# Patient Record
Sex: Female | Born: 1955 | Race: White | Hispanic: No | Marital: Married | Smoking: Former smoker
Health system: Southern US, Community
[De-identification: ages and names within clinical notes are randomized; demographics above are authoritative.]

## PROBLEM LIST (undated history)

## (undated) DIAGNOSIS — I10 Essential (primary) hypertension: Secondary | ICD-10-CM

## (undated) DIAGNOSIS — I351 Nonrheumatic aortic (valve) insufficiency: Secondary | ICD-10-CM

## (undated) DIAGNOSIS — F419 Anxiety disorder, unspecified: Secondary | ICD-10-CM

## (undated) DIAGNOSIS — E785 Hyperlipidemia, unspecified: Secondary | ICD-10-CM

## (undated) DIAGNOSIS — R931 Abnormal findings on diagnostic imaging of heart and coronary circulation: Secondary | ICD-10-CM

## (undated) DIAGNOSIS — J45909 Unspecified asthma, uncomplicated: Secondary | ICD-10-CM

## (undated) DIAGNOSIS — I480 Paroxysmal atrial fibrillation: Secondary | ICD-10-CM

## (undated) HISTORY — DX: Paroxysmal atrial fibrillation: I48.0

## (undated) HISTORY — DX: Hyperlipidemia, unspecified: E78.5

## (undated) HISTORY — DX: Nonrheumatic aortic (valve) insufficiency: I35.1

## (undated) HISTORY — DX: Abnormal findings on diagnostic imaging of heart and coronary circulation: R93.1

---

## 2000-01-20 ENCOUNTER — Other Ambulatory Visit: Admission: RE | Admit: 2000-01-20 | Discharge: 2000-01-20 | Payer: Self-pay | Admitting: Obstetrics and Gynecology

## 2000-02-10 ENCOUNTER — Encounter (INDEPENDENT_AMBULATORY_CARE_PROVIDER_SITE_OTHER): Payer: Self-pay | Admitting: Specialist

## 2000-02-10 ENCOUNTER — Ambulatory Visit (HOSPITAL_COMMUNITY): Admission: RE | Admit: 2000-02-10 | Discharge: 2000-02-10 | Payer: Self-pay | Admitting: Obstetrics and Gynecology

## 2002-04-27 ENCOUNTER — Other Ambulatory Visit: Admission: RE | Admit: 2002-04-27 | Discharge: 2002-04-27 | Payer: Self-pay | Admitting: Obstetrics and Gynecology

## 2003-01-02 ENCOUNTER — Encounter: Payer: Self-pay | Admitting: Allergy and Immunology

## 2003-01-02 ENCOUNTER — Encounter: Admission: RE | Admit: 2003-01-02 | Discharge: 2003-01-02 | Payer: Self-pay | Admitting: Allergy and Immunology

## 2007-02-03 ENCOUNTER — Ambulatory Visit: Payer: Self-pay | Admitting: Hematology & Oncology

## 2007-02-07 ENCOUNTER — Ambulatory Visit: Payer: Self-pay | Admitting: Oncology

## 2007-02-08 LAB — CBC WITH DIFFERENTIAL (CANCER CENTER ONLY)
BASO%: 0.2 % (ref 0.0–2.0)
LYMPH#: 1.1 10*3/uL (ref 0.9–3.3)
LYMPH%: 23.9 % (ref 14.0–48.0)
MONO#: 0.2 10*3/uL (ref 0.1–0.9)
NEUT#: 3.3 10*3/uL (ref 1.5–6.5)
Platelets: 445 10*3/uL — ABNORMAL HIGH (ref 145–400)
RDW: 13 % (ref 10.5–14.6)
WBC: 4.8 10*3/uL (ref 3.9–10.0)

## 2007-02-08 LAB — COMPREHENSIVE METABOLIC PANEL
Albumin: 3.8 g/dL (ref 3.5–5.2)
BUN: 16 mg/dL (ref 6–23)
CO2: 25 mEq/L (ref 19–32)
Glucose, Bld: 99 mg/dL (ref 70–99)
Potassium: 4 mEq/L (ref 3.5–5.3)
Sodium: 138 mEq/L (ref 135–145)
Total Protein: 6 g/dL (ref 6.0–8.3)

## 2007-02-08 LAB — IRON AND TIBC
Iron: 35 ug/dL — ABNORMAL LOW (ref 42–145)
TIBC: 409 ug/dL (ref 250–470)
UIBC: 374 ug/dL

## 2007-02-23 LAB — CBC WITH DIFFERENTIAL (CANCER CENTER ONLY)
BASO%: 0.4 % (ref 0.0–2.0)
HCT: 32.2 % — ABNORMAL LOW (ref 34.8–46.6)
LYMPH#: 1.2 10*3/uL (ref 0.9–3.3)
MONO#: 0.2 10*3/uL (ref 0.1–0.9)
NEUT%: 55.4 % (ref 39.6–80.0)
RBC: 3.51 10*6/uL — ABNORMAL LOW (ref 3.70–5.32)
RDW: 12.2 % (ref 10.5–14.6)
WBC: 3.5 10*3/uL — ABNORMAL LOW (ref 3.9–10.0)

## 2007-03-22 ENCOUNTER — Encounter (INDEPENDENT_AMBULATORY_CARE_PROVIDER_SITE_OTHER): Payer: Self-pay | Admitting: Obstetrics and Gynecology

## 2007-03-22 ENCOUNTER — Ambulatory Visit (HOSPITAL_COMMUNITY): Admission: RE | Admit: 2007-03-22 | Discharge: 2007-03-22 | Payer: Self-pay | Admitting: Obstetrics and Gynecology

## 2007-04-01 ENCOUNTER — Ambulatory Visit: Payer: Self-pay | Admitting: Oncology

## 2007-04-04 LAB — CBC WITH DIFFERENTIAL (CANCER CENTER ONLY)
BASO#: 0 10*3/uL (ref 0.0–0.2)
HCT: 37.9 % (ref 34.8–46.6)
HGB: 12.4 g/dL (ref 11.6–15.9)
LYMPH#: 1.1 10*3/uL (ref 0.9–3.3)
MCHC: 32.7 g/dL (ref 32.0–36.0)
MONO#: 0.3 10*3/uL (ref 0.1–0.9)
NEUT%: 60.1 % (ref 39.6–80.0)
WBC: 4 10*3/uL (ref 3.9–10.0)

## 2007-04-04 LAB — IRON AND TIBC
%SAT: 27 % (ref 20–55)
TIBC: 366 ug/dL (ref 250–470)
UIBC: 267 ug/dL

## 2007-04-04 LAB — FERRITIN: Ferritin: 17 ng/mL (ref 10–291)

## 2007-06-24 ENCOUNTER — Ambulatory Visit: Payer: Self-pay | Admitting: Oncology

## 2007-09-05 ENCOUNTER — Encounter (INDEPENDENT_AMBULATORY_CARE_PROVIDER_SITE_OTHER): Payer: Self-pay | Admitting: General Surgery

## 2007-09-05 ENCOUNTER — Ambulatory Visit (HOSPITAL_BASED_OUTPATIENT_CLINIC_OR_DEPARTMENT_OTHER): Admission: RE | Admit: 2007-09-05 | Discharge: 2007-09-05 | Payer: Self-pay | Admitting: General Surgery

## 2009-08-14 ENCOUNTER — Other Ambulatory Visit: Admission: RE | Admit: 2009-08-14 | Discharge: 2009-08-14 | Payer: Self-pay | Admitting: Family Medicine

## 2009-09-04 ENCOUNTER — Ambulatory Visit (HOSPITAL_COMMUNITY): Admission: RE | Admit: 2009-09-04 | Discharge: 2009-09-04 | Payer: Self-pay | Admitting: Family Medicine

## 2010-08-20 ENCOUNTER — Other Ambulatory Visit (HOSPITAL_COMMUNITY)
Admission: RE | Admit: 2010-08-20 | Discharge: 2010-08-20 | Disposition: A | Discharge status: Home / Self Care | Payer: BC Managed Care – PPO | Source: Ambulatory Visit | Attending: Family Medicine | Admitting: Family Medicine

## 2010-08-20 ENCOUNTER — Other Ambulatory Visit: Payer: Self-pay | Admitting: Family Medicine

## 2010-08-20 DIAGNOSIS — Z124 Encounter for screening for malignant neoplasm of cervix: Secondary | ICD-10-CM | POA: Insufficient documentation

## 2010-09-02 NOTE — Op Note (Signed)
[name], [name]            ACCOUNT NO.:  [id number]   MEDICAL RECORD NO.:  [id number]          PATIENT TYPE:  AMB   LOCATION:  DSC                          FACILITY:  [facility]   PHYSICIAN:  Cherylynn Ridges, M.D.    DATE OF BIRTH:  1955-05-30   DATE OF PROCEDURE:  09/05/2007  DATE OF DISCHARGE:                               OPERATIVE REPORT   PREOPERATIVE DIAGNOSIS:  Right inguinal hernia with extension into the  mons pubis.   POSTOPERATIVE DIAGNOSIS:  Right inguinal lipoma extending into the mons  pubis.   PROCEDURE:  Right inguinal exploration with resection of right inguinal  lipoma.   SURGEON:  Cherylynn Ridges, MD   ANESTHESIA:  General endotracheal.   ESTIMATED BLOOD LOSS:  Less than 20 milliliters.   COMPLICATIONS:  None.   CONDITION:  Stable.   FINDINGS:  A 4.5- x 10-cm lipoma of the mons pubis inguinal area.   INDICATION FOR OPERATION:  The patient is a 55 year old with a somewhat  symptomatic lipoma in her right mons pubis inguinal area, who comes in  now for possible right inguinal hernia repair.   FINDINGS:  The patient had no inguinal hernia noted, either direct or  indirect type.  We did open up the external oblique fascia and explored  the round ligament.  No hernia was found.  The ilioinguinal nerve was  spared.   Extending into the mons pubis was an elongated 4.5- x 10-cm lipoma,  which was resected.   OPERATION:  The patient was taken to the operating room, placed on the  table in supine position.  After an adequate general laryngeal airway  anesthetic was administered, she was prepped and draped in the usual  sterile manner, exposing the right inguinal area.   We started off with about a 5-cm transverse curvilinear incision and  extended that to about 7 cm after initial exploration demonstrated this  to be a large lipoma extending into the mons pubis medially and  inferiorly.   After initial incision, we went down through the subcutaneous  tissues  and Scarpa's fascia down to the external oblique fascia.  We immediately  noticed that the bulge that the patient had been complaining about was  coming on from an inferomedial position.  We were able to dissect out  this structure by placing pressure externally over the drape onto the  mons pubis up towards the inguinal area.  This allowed [place] to expose a  large rounded structure, smooth walled, which was subsequently dissected  out and found to be a large lipoma.   Once this structure was removed, it measured 4.5 x 10 cm in size.  We  opened the external oblique along its fibers using Metzenbaum scissors.  We exposed the round ligament and also the ilioinguinal nerve on the  right side.  By mobilizing round ligament, we could see the conjoint  tendon and the floor of the inguinal canal, which appeared to be intact  with no evidence of hernia.  There was no evidence of any type of hernia  going along with the round ligament itself.  Without any direct repair, we closed the external oblique fascia using  running 3-0 Vicryl suture.  We irrigated with antibiotic solution, then  closed the Scarpa's fascia using interrupted 3-0 Vicryl.  We injected  0.5% Marcaine without epinephrine into the wound, approximately 10 mL  were used.  We then closed the skin using running subcuticular suture of  4-0 Monocryl.  Needle counts, sponge counts, and instrument counts were  correct.      Cherylynn Ridges, M.D.  Electronically Signed     JOW/MEDQ  D:  09/05/2007  T:  09/06/2007  Job:  [id number]   cc:   Maxie Better, M.D.

## 2010-09-02 NOTE — Op Note (Signed)
[name], [name]            ACCOUNT NO.:  [id number]   MEDICAL RECORD NO.:  [id number]          PATIENT TYPE:  AMB   LOCATION:  SDC                           FACILITY:  [facility]   PHYSICIAN:  Maxie Better, M.D.DATE OF BIRTH:  17-Mar-1956   DATE OF PROCEDURE:  03/22/2007  DATE OF DISCHARGE:                               OPERATIVE REPORT   PREOPERATIVE DIAGNOSES:  1. Menometrorrhagia.  2. Endometrial polyps.   PROCEDURE:  1. Diagnostic hysteroscopy with hysteroscopic resection of endometrial      polyps.  2. Dilatation and curettage.   POSTOPERATIVE DIAGNOSES:  1. Menometrorrhagia.  2. Endometrial polyps.   ANESTHESIA:  General; paracervical block.   SURGEON:  Maxie Better, M.D.   INDICATIONS:  This is a 55 year old the patient who has had  menometrorrhagia with associated severe iron deficiency anemia due to  multiple endometrial masses presumed to be endometrial polyps seen on  sonohysterogram, who now presents for surgical management.  The patient  was treated with Norethindrone to manage her bleeding; in addition, she  received IV iron supplementation in order to facilitate the increase of  her hemoglobin from 6 to 12.  The patient now presents for surgical  management.  Surgical risks had been reviewed with the patient.  Consent  was signed.  The patient was transferred to the operating room.   PROCEDURE:  Under adequate general anesthesia, the patient was placed in  the dorsal lithotomy position.  Examination under anesthesia revealed an  anteverted, slightly enlarged uterus.  No adnexal masses could be  appreciated.  The patient was sterilely prepped and draped in the usual  fashion.  Bladder was catheterized for a small amount of urine.  A  bivalve speculum was placed in the vagina; prior to that, there was a  bulging mass noted in the right vulva, mediolateral to the level of the  clitoris and in the mons pubis area, extending down and very mobile  with  question of  bowel herniation or other cystic mass of the mons pubis was  entertained and will be followed up in the postoperative period.  Nonetheless, after the bivalve speculum was placed in the vagina, 10 mL  of 1% Nesacaine were injected paracervical at 3 and 9 o'clock.  The  cervix was parous.  The anterior lip of the cervix was grasped with a  single-toothed tenaculum.  The cervix was easily dilated up to the #25  [facility] dilator.  Therefore, the resectoscope with a single loop was  introduced into the uterine cavity.  Blood was noted, as was the  polypoid lesion and no distinct identification of any areas of the  uterus otherwise was noted.  Therefore, the resectoscope was removed.  The cavity was then curetted and explored with the ring forceps.  At  this time, the resectoscope was then reinserted.  Multiple polypoid  lesions throughout the cavity were noted.  The right tubal ostium  initially could be seen well.  Those polypoid lesions were then removed  anteriorly and posteriorly and on the left, after removal of the  polypoid lesion, the left tubal ostia could then ultimately  be seen.  This was carried down until all polypoid lesions were resected, cavity  curetted and resected.  When the cavity was felt to have been cleaned of  all polypoid lesions, the procedure was terminated by removing all  instruments.  Specimen labeled endometrial polyps with resections  were sent to Pathology, as were as endometrial curettings.  Estimated  blood loss was minimal.  Fluid deficit was 310 cc sorbitol.  Complication:  None.  The patient tolerated the procedure well and was  transferred to recovery room in stable condition.      Maxie Better, M.D.  Electronically Signed     /MEDQ  D:  03/22/2007  T:  03/23/2007  Job:  [id number]

## 2010-09-05 NOTE — Op Note (Signed)
[facility]  Patient:    [name]                    MRN: [id number] Proc. Date: 02/10/00 Attending:  Janeece Riggers. Dareen Piano, M.D.                           Operative Report  PREOPERATIVE DIAGNOSES:       1. Chronic left lower quadrant pain.                               2. Left ovarian cyst.  POSTOPERATIVE DIAGNOSES:      1. Chronic left lower quadrant pain.                               2. Left ovarian cyst.  PROCEDURE:                    Diagnostic laparoscopy with left oophorectomy.  SURGEON:                      Mark E. Dareen Piano, M.D.  ANESTHESIA:                   General endotracheal.  ANTIBIOTICS:                  Ancef 1 g.  ESTIMATED BLOOD LOSS:         Minimal.  COMPLICATIONS:                None.  SPECIMENS:                    Left tube and ovary sent to pathology.  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  The gallbladder appeared to be normal.  The appendix was surgically absent; however, the cecum did have adhesions to the anterior abdominal wall which were transected.  The anterior cul-de-sac was normal.  The posterior cul-de-sac had no evidence of endometriosis or adhesions.  The right ovary had a 2.0 cm follicular cyst which was drained.  There was no evidence of endometriosis.  The fallopian tube on the right was normal.  The fallopian tube on the left was normal.  The patients left ovary was somewhat irregular in shape; however, no definitive cyst was identified.  In light of this, the surgery was paused, and I went out to discuss the findings with the husband. After a discussion about the pros and cons of an oophorectomy, we agreed to proceed with a left oophorectomy and salpingectomy.  At this point, the infundibular pelvic ligament was isolated.  The ureter was identified.  The infundibular pelvic ligament was  cauterized and transected.  The ovarian ligament and fallopian tube were cauterized and transected.  The specimen was put in an Endo bag and then removed through the lower abdominal incision.  The adhesions between the cecum and the anterior abdominal wall were then taken down sharply.  Hemostasis was checked again and felt to be adequate.  On removing the ovary there was evidence of old blood in the ovary, consistent with the resulting hemorrhagic cyst.  The patient tolerated the  procedure well and was taken to the recovery room in stable condition.  DISPOSITION:                  She was discharged home with Tylox to take p.r.n.  She will follow up in the office in four weeks. DD:  02/10/00 TD:  02/10/00 Job: [id number]

## 2010-09-12 ENCOUNTER — Other Ambulatory Visit (HOSPITAL_COMMUNITY): Payer: Self-pay | Admitting: Family Medicine

## 2010-09-12 DIAGNOSIS — Z1231 Encounter for screening mammogram for malignant neoplasm of breast: Secondary | ICD-10-CM

## 2010-09-26 ENCOUNTER — Ambulatory Visit (HOSPITAL_COMMUNITY)
Admission: RE | Admit: 2010-09-26 | Discharge: 2010-09-26 | Disposition: A | Discharge status: Home / Self Care | Payer: BC Managed Care – PPO | Source: Ambulatory Visit | Attending: Family Medicine | Admitting: Family Medicine

## 2010-09-26 DIAGNOSIS — Z1231 Encounter for screening mammogram for malignant neoplasm of breast: Secondary | ICD-10-CM | POA: Insufficient documentation

## 2011-01-26 LAB — CBC
HCT: 38.3
Hemoglobin: 12.8
MCHC: 33.4
MCV: 90.3
RBC: 4.24

## 2011-09-23 ENCOUNTER — Other Ambulatory Visit (HOSPITAL_COMMUNITY): Payer: Self-pay | Admitting: Family Medicine

## 2011-09-23 DIAGNOSIS — Z1231 Encounter for screening mammogram for malignant neoplasm of breast: Secondary | ICD-10-CM

## 2011-10-14 ENCOUNTER — Ambulatory Visit (HOSPITAL_COMMUNITY)
Admission: RE | Admit: 2011-10-14 | Discharge: 2011-10-14 | Disposition: A | Discharge status: Home / Self Care | Payer: BC Managed Care – PPO | Source: Ambulatory Visit | Attending: Family Medicine | Admitting: Family Medicine

## 2011-10-14 DIAGNOSIS — Z1231 Encounter for screening mammogram for malignant neoplasm of breast: Secondary | ICD-10-CM

## 2012-03-08 ENCOUNTER — Other Ambulatory Visit: Payer: Self-pay | Admitting: Family Medicine

## 2012-03-08 ENCOUNTER — Other Ambulatory Visit (HOSPITAL_COMMUNITY)
Admission: RE | Admit: 2012-03-08 | Discharge: 2012-03-08 | Disposition: A | Discharge status: Home / Self Care | Payer: BC Managed Care – PPO | Source: Ambulatory Visit | Attending: Family Medicine | Admitting: Family Medicine

## 2012-03-08 DIAGNOSIS — Z124 Encounter for screening for malignant neoplasm of cervix: Secondary | ICD-10-CM | POA: Insufficient documentation

## 2012-09-20 ENCOUNTER — Other Ambulatory Visit (HOSPITAL_COMMUNITY): Payer: Self-pay | Admitting: Family Medicine

## 2012-09-20 DIAGNOSIS — Z1231 Encounter for screening mammogram for malignant neoplasm of breast: Secondary | ICD-10-CM

## 2012-10-18 ENCOUNTER — Ambulatory Visit (HOSPITAL_COMMUNITY)
Admission: RE | Admit: 2012-10-18 | Discharge: 2012-10-18 | Disposition: A | Discharge status: Home / Self Care | Payer: BC Managed Care – PPO | Source: Ambulatory Visit | Attending: Family Medicine | Admitting: Family Medicine

## 2012-10-18 DIAGNOSIS — Z1231 Encounter for screening mammogram for malignant neoplasm of breast: Secondary | ICD-10-CM

## 2013-10-05 ENCOUNTER — Other Ambulatory Visit (HOSPITAL_COMMUNITY): Payer: Self-pay | Admitting: Family Medicine

## 2013-10-05 DIAGNOSIS — Z1231 Encounter for screening mammogram for malignant neoplasm of breast: Secondary | ICD-10-CM

## 2013-10-23 ENCOUNTER — Ambulatory Visit (HOSPITAL_COMMUNITY)
Admission: RE | Admit: 2013-10-23 | Discharge: 2013-10-23 | Disposition: A | Discharge status: Home / Self Care | Payer: BC Managed Care – PPO | Source: Ambulatory Visit | Attending: Family Medicine | Admitting: Family Medicine

## 2013-10-23 DIAGNOSIS — Z1231 Encounter for screening mammogram for malignant neoplasm of breast: Secondary | ICD-10-CM

## 2014-10-02 ENCOUNTER — Other Ambulatory Visit (HOSPITAL_COMMUNITY): Payer: Self-pay | Admitting: Family Medicine

## 2014-10-02 DIAGNOSIS — Z1231 Encounter for screening mammogram for malignant neoplasm of breast: Secondary | ICD-10-CM

## 2014-10-25 ENCOUNTER — Ambulatory Visit (HOSPITAL_COMMUNITY)
Admission: RE | Admit: 2014-10-25 | Discharge: 2014-10-25 | Disposition: A | Discharge status: Home / Self Care | Payer: BC Managed Care – PPO | Source: Ambulatory Visit | Attending: Family Medicine | Admitting: Family Medicine

## 2014-10-25 DIAGNOSIS — Z1231 Encounter for screening mammogram for malignant neoplasm of breast: Secondary | ICD-10-CM | POA: Diagnosis present

## 2015-10-15 ENCOUNTER — Other Ambulatory Visit: Payer: Self-pay | Admitting: Family Medicine

## 2015-10-15 ENCOUNTER — Other Ambulatory Visit (HOSPITAL_COMMUNITY)
Admission: RE | Admit: 2015-10-15 | Discharge: 2015-10-15 | Disposition: A | Discharge status: Home / Self Care | Payer: BC Managed Care – PPO | Source: Ambulatory Visit | Attending: Family Medicine | Admitting: Family Medicine

## 2015-10-15 DIAGNOSIS — Z1151 Encounter for screening for human papillomavirus (HPV): Secondary | ICD-10-CM | POA: Insufficient documentation

## 2015-10-15 DIAGNOSIS — Z01419 Encounter for gynecological examination (general) (routine) without abnormal findings: Secondary | ICD-10-CM | POA: Insufficient documentation

## 2015-10-16 LAB — CYTOLOGY - PAP

## 2015-10-17 ENCOUNTER — Other Ambulatory Visit: Payer: Self-pay | Admitting: Family Medicine

## 2015-10-17 DIAGNOSIS — Z1231 Encounter for screening mammogram for malignant neoplasm of breast: Secondary | ICD-10-CM

## 2015-11-05 ENCOUNTER — Other Ambulatory Visit: Payer: Self-pay | Admitting: Family Medicine

## 2015-11-05 ENCOUNTER — Ambulatory Visit
Admission: RE | Admit: 2015-11-05 | Discharge: 2015-11-05 | Disposition: A | Discharge status: Home / Self Care | Payer: BC Managed Care – PPO | Source: Ambulatory Visit | Attending: Family Medicine | Admitting: Family Medicine

## 2015-11-05 DIAGNOSIS — Z1231 Encounter for screening mammogram for malignant neoplasm of breast: Secondary | ICD-10-CM

## 2016-10-01 ENCOUNTER — Other Ambulatory Visit: Payer: Self-pay | Admitting: Family Medicine

## 2016-10-01 DIAGNOSIS — Z1231 Encounter for screening mammogram for malignant neoplasm of breast: Secondary | ICD-10-CM

## 2016-11-05 ENCOUNTER — Ambulatory Visit
Admission: RE | Admit: 2016-11-05 | Discharge: 2016-11-05 | Disposition: A | Discharge status: Home / Self Care | Payer: BC Managed Care – PPO | Source: Ambulatory Visit | Attending: Family Medicine | Admitting: Family Medicine

## 2016-11-05 DIAGNOSIS — Z1231 Encounter for screening mammogram for malignant neoplasm of breast: Secondary | ICD-10-CM

## 2017-04-23 ENCOUNTER — Ambulatory Visit
Admission: RE | Admit: 2017-04-23 | Discharge: 2017-04-23 | Disposition: A | Discharge status: Home / Self Care | Payer: BC Managed Care – PPO | Source: Ambulatory Visit | Attending: Family Medicine | Admitting: Family Medicine

## 2017-04-23 ENCOUNTER — Other Ambulatory Visit: Payer: Self-pay | Admitting: Family Medicine

## 2017-04-23 DIAGNOSIS — M79661 Pain in right lower leg: Secondary | ICD-10-CM

## 2017-04-23 DIAGNOSIS — R609 Edema, unspecified: Secondary | ICD-10-CM

## 2017-10-04 ENCOUNTER — Other Ambulatory Visit: Payer: Self-pay | Admitting: Family Medicine

## 2017-10-04 DIAGNOSIS — Z1231 Encounter for screening mammogram for malignant neoplasm of breast: Secondary | ICD-10-CM

## 2017-11-08 ENCOUNTER — Ambulatory Visit
Admission: RE | Admit: 2017-11-08 | Discharge: 2017-11-08 | Disposition: A | Discharge status: Home / Self Care | Payer: BC Managed Care – PPO | Source: Ambulatory Visit | Attending: Family Medicine | Admitting: Family Medicine

## 2017-11-08 DIAGNOSIS — Z1231 Encounter for screening mammogram for malignant neoplasm of breast: Secondary | ICD-10-CM

## 2019-01-02 ENCOUNTER — Other Ambulatory Visit (HOSPITAL_COMMUNITY)
Admission: RE | Admit: 2019-01-02 | Discharge: 2019-01-02 | Disposition: A | Discharge status: Home / Self Care | Payer: BC Managed Care – PPO | Source: Ambulatory Visit | Attending: Family Medicine | Admitting: Family Medicine

## 2019-01-02 ENCOUNTER — Other Ambulatory Visit: Payer: Self-pay | Admitting: Family Medicine

## 2019-01-02 DIAGNOSIS — Z01411 Encounter for gynecological examination (general) (routine) with abnormal findings: Secondary | ICD-10-CM | POA: Insufficient documentation

## 2019-01-05 LAB — CYTOLOGY - PAP
Diagnosis: NEGATIVE
HPV: NOT DETECTED

## 2019-01-06 ENCOUNTER — Other Ambulatory Visit: Payer: Self-pay | Admitting: Family Medicine

## 2019-01-06 DIAGNOSIS — Z1231 Encounter for screening mammogram for malignant neoplasm of breast: Secondary | ICD-10-CM

## 2019-02-21 ENCOUNTER — Ambulatory Visit: Payer: BC Managed Care – PPO

## 2019-04-05 ENCOUNTER — Ambulatory Visit: Payer: BC Managed Care – PPO

## 2019-05-22 ENCOUNTER — Ambulatory Visit
Admission: RE | Admit: 2019-05-22 | Discharge: 2019-05-22 | Disposition: A | Discharge status: Home / Self Care | Payer: BC Managed Care – PPO | Source: Ambulatory Visit | Attending: Family Medicine | Admitting: Family Medicine

## 2019-05-22 ENCOUNTER — Other Ambulatory Visit: Payer: Self-pay

## 2019-05-22 DIAGNOSIS — Z1231 Encounter for screening mammogram for malignant neoplasm of breast: Secondary | ICD-10-CM

## 2020-08-19 ENCOUNTER — Other Ambulatory Visit: Payer: Self-pay | Admitting: Family Medicine

## 2020-08-19 DIAGNOSIS — Z1231 Encounter for screening mammogram for malignant neoplasm of breast: Secondary | ICD-10-CM

## 2020-10-10 ENCOUNTER — Other Ambulatory Visit: Payer: Self-pay

## 2020-10-10 ENCOUNTER — Ambulatory Visit
Admission: RE | Admit: 2020-10-10 | Discharge: 2020-10-10 | Disposition: A | Discharge status: Home / Self Care | Payer: BC Managed Care – PPO | Source: Ambulatory Visit | Attending: Family Medicine | Admitting: Family Medicine

## 2020-10-10 DIAGNOSIS — Z1231 Encounter for screening mammogram for malignant neoplasm of breast: Secondary | ICD-10-CM

## 2021-01-22 ENCOUNTER — Other Ambulatory Visit: Payer: Self-pay | Admitting: Family Medicine

## 2021-01-22 DIAGNOSIS — Z13820 Encounter for screening for osteoporosis: Secondary | ICD-10-CM

## 2021-07-15 ENCOUNTER — Ambulatory Visit
Admission: RE | Admit: 2021-07-15 | Discharge: 2021-07-15 | Disposition: A | Discharge status: Home / Self Care | Payer: Medicare PPO | Source: Ambulatory Visit | Attending: Family Medicine | Admitting: Family Medicine

## 2021-07-15 DIAGNOSIS — Z13820 Encounter for screening for osteoporosis: Secondary | ICD-10-CM

## 2022-01-01 DIAGNOSIS — I10 Essential (primary) hypertension: Secondary | ICD-10-CM | POA: Diagnosis not present

## 2022-01-01 DIAGNOSIS — U071 COVID-19: Secondary | ICD-10-CM | POA: Diagnosis not present

## 2022-01-19 ENCOUNTER — Other Ambulatory Visit: Payer: Self-pay | Admitting: Family Medicine

## 2022-01-19 ENCOUNTER — Other Ambulatory Visit (HOSPITAL_COMMUNITY): Payer: Self-pay | Admitting: Family Medicine

## 2022-01-19 DIAGNOSIS — Z0000 Encounter for general adult medical examination without abnormal findings: Secondary | ICD-10-CM | POA: Diagnosis not present

## 2022-01-19 DIAGNOSIS — E039 Hypothyroidism, unspecified: Secondary | ICD-10-CM | POA: Diagnosis not present

## 2022-01-19 DIAGNOSIS — Z79899 Other long term (current) drug therapy: Secondary | ICD-10-CM | POA: Diagnosis not present

## 2022-01-19 DIAGNOSIS — F410 Panic disorder [episodic paroxysmal anxiety] without agoraphobia: Secondary | ICD-10-CM | POA: Diagnosis not present

## 2022-01-19 DIAGNOSIS — E7800 Pure hypercholesterolemia, unspecified: Secondary | ICD-10-CM | POA: Diagnosis not present

## 2022-01-19 DIAGNOSIS — J4520 Mild intermittent asthma, uncomplicated: Secondary | ICD-10-CM | POA: Diagnosis not present

## 2022-01-19 DIAGNOSIS — I10 Essential (primary) hypertension: Secondary | ICD-10-CM | POA: Diagnosis not present

## 2022-01-19 DIAGNOSIS — R0981 Nasal congestion: Secondary | ICD-10-CM | POA: Diagnosis not present

## 2022-01-21 ENCOUNTER — Other Ambulatory Visit: Payer: Self-pay | Admitting: Family Medicine

## 2022-01-21 DIAGNOSIS — Z1231 Encounter for screening mammogram for malignant neoplasm of breast: Secondary | ICD-10-CM

## 2022-02-13 ENCOUNTER — Encounter (HOSPITAL_BASED_OUTPATIENT_CLINIC_OR_DEPARTMENT_OTHER): Payer: Self-pay

## 2022-02-13 ENCOUNTER — Other Ambulatory Visit: Payer: Self-pay

## 2022-02-13 ENCOUNTER — Emergency Department (HOSPITAL_BASED_OUTPATIENT_CLINIC_OR_DEPARTMENT_OTHER): Payer: Medicare PPO | Admitting: Radiology

## 2022-02-13 ENCOUNTER — Emergency Department (HOSPITAL_BASED_OUTPATIENT_CLINIC_OR_DEPARTMENT_OTHER)
Admission: EM | Admit: 2022-02-13 | Discharge: 2022-02-13 | Disposition: A | Discharge status: Home / Self Care | Payer: Medicare PPO | Attending: Emergency Medicine | Admitting: Emergency Medicine

## 2022-02-13 DIAGNOSIS — R002 Palpitations: Secondary | ICD-10-CM | POA: Diagnosis present

## 2022-02-13 DIAGNOSIS — Z7901 Long term (current) use of anticoagulants: Secondary | ICD-10-CM | POA: Diagnosis not present

## 2022-02-13 DIAGNOSIS — I10 Essential (primary) hypertension: Secondary | ICD-10-CM | POA: Insufficient documentation

## 2022-02-13 DIAGNOSIS — I480 Paroxysmal atrial fibrillation: Secondary | ICD-10-CM | POA: Diagnosis not present

## 2022-02-13 DIAGNOSIS — R009 Unspecified abnormalities of heart beat: Secondary | ICD-10-CM | POA: Diagnosis not present

## 2022-02-13 DIAGNOSIS — I4891 Unspecified atrial fibrillation: Secondary | ICD-10-CM | POA: Insufficient documentation

## 2022-02-13 DIAGNOSIS — J9811 Atelectasis: Secondary | ICD-10-CM | POA: Diagnosis not present

## 2022-02-13 DIAGNOSIS — Z79899 Other long term (current) drug therapy: Secondary | ICD-10-CM | POA: Diagnosis not present

## 2022-02-13 DIAGNOSIS — J45909 Unspecified asthma, uncomplicated: Secondary | ICD-10-CM | POA: Insufficient documentation

## 2022-02-13 DIAGNOSIS — R030 Elevated blood-pressure reading, without diagnosis of hypertension: Secondary | ICD-10-CM | POA: Diagnosis not present

## 2022-02-13 HISTORY — DX: Anxiety disorder, unspecified: F41.9

## 2022-02-13 HISTORY — DX: Essential (primary) hypertension: I10

## 2022-02-13 HISTORY — DX: Unspecified asthma, uncomplicated: J45.909

## 2022-02-13 LAB — TSH: TSH: 1.564 u[IU]/mL (ref 0.350–4.500)

## 2022-02-13 LAB — BASIC METABOLIC PANEL
Anion gap: 12 (ref 5–15)
BUN: 18 mg/dL (ref 8–23)
CO2: 24 mmol/L (ref 22–32)
Calcium: 9.6 mg/dL (ref 8.9–10.3)
Chloride: 102 mmol/L (ref 98–111)
Creatinine, Ser: 0.77 mg/dL (ref 0.44–1.00)
GFR, Estimated: >60 mL/min (ref >60)
Glucose, Bld: 101 mg/dL — ABNORMAL HIGH (ref 70–99)
Potassium: 3.6 mmol/L (ref 3.5–5.1)
Sodium: 138 mmol/L (ref 135–145)

## 2022-02-13 LAB — CBC
HCT: 40.5 % (ref 36.0–46.0)
Hemoglobin: 13.1 g/dL (ref 12.0–15.0)
MCH: 29.2 pg (ref 26.0–34.0)
MCHC: 32.3 g/dL (ref 30.0–36.0)
MCV: 90.2 fL (ref 80.0–100.0)
Platelets: 272 10*3/uL (ref 150–400)
RBC: 4.49 MIL/uL (ref 3.87–5.11)
RDW: 13.9 % (ref 11.5–15.5)
WBC: 5.3 10*3/uL (ref 4.0–10.5)
nRBC: 0 % (ref 0.0–0.2)

## 2022-02-13 LAB — MAGNESIUM: Magnesium: 1.8 mg/dL (ref 1.7–2.4)

## 2022-02-13 LAB — TROPONIN I (HIGH SENSITIVITY): Troponin I (High Sensitivity): <2 ng/L (ref <18)

## 2022-02-13 MED ORDER — METOPROLOL TARTRATE 5 MG/5ML IV SOLN
5.0000 mg | INTRAVENOUS | Status: DC | PRN
  Administered 2022-02-13: 5 mg via INTRAVENOUS
  Filled 2022-02-13: qty 5

## 2022-02-13 MED ORDER — APIXABAN 2.5 MG PO TABS
5.0000 mg | ORAL_TABLET | Freq: Once | ORAL | Status: AC
  Administered 2022-02-13: 5 mg via ORAL
  Filled 2022-02-13: qty 2

## 2022-02-13 MED ORDER — APIXABAN 5 MG PO TABS: 5.0000 mg | ORAL_TABLET | Freq: Two times a day (BID) | ORAL | 1 refills | Status: DC

## 2022-02-13 MED ORDER — METOPROLOL TARTRATE 25 MG PO TABS: 25.0000 mg | ORAL_TABLET | Freq: Once | ORAL | Status: DC

## 2022-02-13 MED ORDER — METOPROLOL TARTRATE 25 MG PO TABS
12.5000 mg | ORAL_TABLET | Freq: Once | ORAL | Status: AC
  Administered 2022-02-13: 12.5 mg via ORAL
  Filled 2022-02-13: qty 1

## 2022-02-13 MED ORDER — METOPROLOL TARTRATE 25 MG PO TABS
12.5000 mg | ORAL_TABLET | ORAL | Status: AC
  Administered 2022-02-13: 12.5 mg via ORAL
  Filled 2022-02-13: qty 1

## 2022-02-13 MED ORDER — METOPROLOL TARTRATE 5 MG/5ML IV SOLN
2.5000 mg | INTRAVENOUS | Status: AC
  Administered 2022-02-13: 2.5 mg via INTRAVENOUS
  Filled 2022-02-13: qty 5

## 2022-02-13 MED ORDER — METOPROLOL TARTRATE 25 MG PO TABS: 25.0000 mg | ORAL_TABLET | Freq: Two times a day (BID) | ORAL | 2 refills | Status: DC

## 2022-02-13 NOTE — ED Provider Notes (Signed)
  [place] EMERGENCY DEPT Provider Note   CSN: [id number] Arrival date & time: 02/13/22  1728     History {Add pertinent medical, surgical, social history, OB history to HPI:1} Chief Complaint  Patient presents with   Palpitations    [name] is a 66 y.o. female.  Monday watch had HR in 120s, palpitations no other sxs  Went to UC and referred her into the ED  vaccine 1 week ago today HTN, HLD?, hypothyroid?, asthma last used 1 week ago levothyroxine,  metoprolol succinate ER 25 mg tablet,extended release 24 hr  2 glasses of wine or jack daniels per night  recent covid 3 points chads, 2 points hasbled (no HTN)   Past Medical History:  Diagnosis Date   Anxiety    Asthma    Hypertension        Home Medications Prior to Admission medications   Not on File      Allergies    Amoxicillin and Diclofenac    Review of Systems   Review of Systems  Physical Exam Updated Vital Signs BP (!) 158/118   Pulse 89   Temp 98.4 F (36.9 C)   Resp 19   Ht 5\' 4"  (1.626 m)   Wt 75.8 kg   LMP 09/21/2010   SpO2 100%   BMI 28.67 kg/m  Physical Exam  ED Results / Procedures / Treatments   Labs (all labs ordered are listed, but only abnormal results are displayed) Labs Reviewed  CBC  BASIC METABOLIC PANEL  TROPONIN I (HIGH SENSITIVITY)    EKG EKG Interpretation  Date/Time:  Friday February 13 2022 17:43:42 EDT Ventricular Rate:  128 PR Interval:    QRS Duration: 80 QT Interval:  316 QTC Calculation: 461 R Axis:   -66 Text Interpretation: Atrial fibrillation with rapid ventricular response Left axis deviation Possible Anteroseptal infarct (cited on or before 05-Sep-2007) Abnormal ECG When compared with ECG of 05-Sep-2007 09:03, AF RVR now present. Confirmed by Margaretmary Eddy [phone]) on 02/13/2022 6:08:38 PM  Radiology No results found.  Procedures Procedures  {Document cardiac monitor, telemetry assessment procedure when  appropriate:1}  Medications Ordered in ED Medications - No data to display  ED Course/ Medical Decision Making/ A&P                           Medical Decision Making Amount and/or Complexity of Data Reviewed Labs: ordered. Radiology: ordered.   ***  {Document critical care time when appropriate:1} {Document review of labs and clinical decision tools ie heart score, Chads2Vasc2 etc:1}  {Document your independent review of radiology images, and any outside records:1} {Document your discussion with family members, caretakers, and with consultants:1} {Document social determinants of health affecting pt's care:1} {Document your decision making why or why not admission, treatments were needed:1} Final Clinical Impression(s) / ED Diagnoses Final diagnoses:  None    Rx / DC Orders ED Discharge Orders     None

## 2022-02-13 NOTE — Discharge Instructions (Addendum)
Today you were seen in the emergency department for your atrial fibrillation.    In the emergency department you were given a medication called metoprolol and started on another medication called Eliquis which is a blood thinner.  Please take these medications as prescribed to prevent rapid heart rates and stroke.  Check your MyChart online for the results of any tests that had not resulted by the time you left the emergency department.   Follow-up with your primary doctor in 2-3 days regarding your visit.  If you do not hear from the atrial fibrillation clinic in 72 hours please call them to set up an appointment.  Return immediately to the emergency department if you experience any of the following: Chest pain, shortness of breath, fainting, or any other concerning symptoms.    Thank you for visiting our Emergency Department. It was a pleasure taking care of you today.

## 2022-02-13 NOTE — ED Triage Notes (Signed)
Pt states since Monday, her HR has been fluctuating from 50s to 120s. She went to a walk-in-clinic and they performed an EKG and was told she was in A-fib. Pt denies any cp or sob, states she has just been feeling anxious and nervous.

## 2022-02-23 ENCOUNTER — Other Ambulatory Visit: Payer: Self-pay

## 2022-02-23 ENCOUNTER — Ambulatory Visit (HOSPITAL_COMMUNITY)
Admission: RE | Admit: 2022-02-23 | Discharge: 2022-02-23 | Disposition: A | Discharge status: Home / Self Care | Payer: Medicare PPO | Source: Ambulatory Visit | Attending: Physician Assistant | Admitting: Physician Assistant

## 2022-02-23 VITALS — BP 126/80 | HR 89 | Ht 64.0 in | Wt 165.6 lb

## 2022-02-23 DIAGNOSIS — I4819 Other persistent atrial fibrillation: Secondary | ICD-10-CM | POA: Insufficient documentation

## 2022-02-23 DIAGNOSIS — Z79899 Other long term (current) drug therapy: Secondary | ICD-10-CM | POA: Diagnosis not present

## 2022-02-23 DIAGNOSIS — Z7901 Long term (current) use of anticoagulants: Secondary | ICD-10-CM | POA: Diagnosis not present

## 2022-02-23 DIAGNOSIS — J45909 Unspecified asthma, uncomplicated: Secondary | ICD-10-CM | POA: Insufficient documentation

## 2022-02-23 DIAGNOSIS — I10 Essential (primary) hypertension: Secondary | ICD-10-CM | POA: Diagnosis not present

## 2022-02-23 DIAGNOSIS — D6869 Other thrombophilia: Secondary | ICD-10-CM | POA: Diagnosis not present

## 2022-02-23 NOTE — H&P (View-Only) (Signed)
Primary Care Physician: Saintclair Halsted, FNP Primary Cardiologist: none Primary Electrophysiologist: none Referring Physician: [facility]   [name] is a 66 y.o. female with a history of HTN, asthma, atrial fibrillation who presents for consultation in the [facility].  The patient was initially diagnosed with atrial fibrillation 02/13/22 after presenting to the ED with symptoms of palpitations. Her smart watch showed elevated heart rates in the 120s at rest. ECG showed afib with RVR and she was started on metoprolol for rate control and Eliquis for a CHADS2VASC score of 3. Patient remains in rate controlled afib with symptoms of fatigue. Of note, she was diagnosed with COVID the month prior to the onset of her afib.  Today, she denies symptoms of chest pain, shortness of breath, orthopnea, PND, lower extremity edema, dizziness, presyncope, syncope, snoring, daytime somnolence, bleeding, or neurologic sequela. The patient is tolerating medications without difficulties and is otherwise without complaint today.    Atrial Fibrillation Risk Factors:  she does not have symptoms or diagnosis of sleep apnea. she does not have a history of rheumatic fever. she does have a history of alcohol use. The patient does not have a history of early familial atrial fibrillation or other arrhythmias.  she has a BMI of Body mass index is 28.43 kg/m.Marland Kitchen Filed Weights   02/23/22 1419  Weight: 75.1 kg    No family history on file.   Atrial Fibrillation Management history:  Previous antiarrhythmic drugs: none Previous cardioversions: none Previous ablations: none CHADS2VASC score: 3 Anticoagulation history: Eliquis   Past Medical History:  Diagnosis Date   Anxiety    Asthma    Hypertension    No past surgical history on file.  Current Outpatient Medications  Medication Sig Dispense Refill   albuterol (VENTOLIN HFA) 108 (90 Base) MCG/ACT inhaler  Inhale 2 puffs into the lungs every 4 (four) hours as needed.     ALPRAZolam (XANAX) 0.25 MG tablet TAKE 1 TABLET 3 TIMES A DAY BY ORAL ROUTE.     amLODipine (NORVASC) 10 MG tablet Take 1 tablet by mouth daily.     apixaban (ELIQUIS) 5 MG TABS tablet Take 1 tablet (5 mg total) by mouth 2 (two) times daily. 60 tablet 1   escitalopram (LEXAPRO) 10 MG tablet Take 15 mg by mouth daily.     levothyroxine (SYNTHROID) 75 MCG tablet Take 1 tablet by mouth daily.     losartan (COZAAR) 100 MG tablet Take 1 tablet by mouth daily.     metoprolol tartrate (LOPRESSOR) 25 MG tablet Take 1 tablet (25 mg total) by mouth 2 (two) times daily. 60 tablet 2   montelukast (SINGULAIR) 10 MG tablet Take 10 mg by mouth daily.     Omega-3 Fatty Acids (FISH OIL OMEGA-3 PO) Take 2 capsules by mouth at bedtime.     Probiotic Product (PROBIOTIC PO) Take 1 tablet by mouth every morning.     No current facility-administered medications for this encounter.    Allergies  Allergen Reactions   Amoxicillin Other (See Comments)    Nervous   Diclofenac Other (See Comments)    Hives, rapid heart beat    Wheat Bran Other (See Comments)    Social History   Socioeconomic History   Marital status: Divorced    Spouse name: Not on file   Number of children: Not on file   Years of education: Not on file   Highest education level: Not on file  Occupational History  Not on file  Tobacco Use   Smoking status: Never   Smokeless tobacco: Never  Substance and Sexual Activity   Alcohol use: Yes   Drug use: Never   Sexual activity: Not on file  Other Topics Concern   Not on file  Social History Narrative   Not on file   Social Determinants of Health   Financial Resource Strain: Not on file  Food Insecurity: Not on file  Transportation Needs: Not on file  Physical Activity: Not on file  Stress: Not on file  Social Connections: Not on file  Intimate Partner Violence: Not on file     ROS- All systems are reviewed  and negative except as per the HPI above.  Physical Exam: Vitals:   02/23/22 1419  BP: 126/80  Pulse: 89  Weight: 75.1 kg  Height: 5\' 4"  (1.626 m)    GEN- The patient is a well appearing female, alert and oriented x 3 today.   Head- normocephalic, atraumatic Eyes-  Sclera clear, conjunctiva pink Ears- hearing intact Oropharynx- clear Neck- supple  Lungs- Clear to ausculation bilaterally, normal work of breathing Heart- irregular rate and rhythm, no murmurs, rubs or gallops  GI- soft, NT, ND, + BS Extremities- no clubbing, cyanosis, or edema MS- no significant deformity or atrophy Skin- no rash or lesion Psych- euthymic mood, full affect Neuro- strength and sensation are intact  Wt Readings from Last 3 Encounters:  02/23/22 75.1 kg  02/13/22 75.8 kg    EKG today demonstrates  Afib Vent. rate 89 BPM PR interval * ms QRS duration 82 ms QT/QTcB 366/445 ms   Epic records are reviewed at length today  CHA2DS2-VASc Score = 3  The patient's score is based upon: CHF History: 0 HTN History: 1 Diabetes History: 0 Stroke History: 0 Vascular Disease History: 0 Age Score: 1 Gender Score: 1       ASSESSMENT AND PLAN: 1. Persistent Atrial Fibrillation (ICD10:  I48.19) The patient's CHA2DS2-VASc score is 3, indicating a 3.2% annual risk of stroke.   General education about afib provided and questions answered. We also discussed her stroke risk and the risks and benefits of anticoagulation. COVID trigger? We discussed rhythm control options today. Will plan for DCCV after 3 weeks of uninterrupted anticoagulation.  Continue Lopressor 25 mg BID Continue Eliquis 5 mg BID Check echocardiogram  2. Secondary Hypercoagulable State (ICD10:  D68.69) The patient is at significant risk for stroke/thromboembolism based upon her CHA2DS2-VASc Score of 3.  Continue Apixaban (Eliquis).   3. HTN Stable, no changes today.    Follow up in the AF clinic post DCCV.    [facility] [address] [place], [place] [address] [phone] 02/23/2022 2:51 PM

## 2022-02-23 NOTE — Progress Notes (Signed)
Primary Care Physician: Saintclair Halsted, FNP Primary Cardiologist: none Primary Electrophysiologist: none Referring Physician: [facility]   [name] is a 66 y.o. female with a history of HTN, asthma, atrial fibrillation who presents for consultation in the [facility].  The patient was initially diagnosed with atrial fibrillation 02/13/22 after presenting to the ED with symptoms of palpitations. Her smart watch showed elevated heart rates in the 120s at rest. ECG showed afib with RVR and she was started on metoprolol for rate control and Eliquis for a CHADS2VASC score of 3. Patient remains in rate controlled afib with symptoms of fatigue. Of note, she was diagnosed with COVID the month prior to the onset of her afib.  Today, she denies symptoms of chest pain, shortness of breath, orthopnea, PND, lower extremity edema, dizziness, presyncope, syncope, snoring, daytime somnolence, bleeding, or neurologic sequela. The patient is tolerating medications without difficulties and is otherwise without complaint today.    Atrial Fibrillation Risk Factors:  she does not have symptoms or diagnosis of sleep apnea. she does not have a history of rheumatic fever. she does have a history of alcohol use. The patient does not have a history of early familial atrial fibrillation or other arrhythmias.  she has a BMI of Body mass index is 28.43 kg/m.Marland Kitchen Filed Weights   02/23/22 1419  Weight: 75.1 kg    No family history on file.   Atrial Fibrillation Management history:  Previous antiarrhythmic drugs: none Previous cardioversions: none Previous ablations: none CHADS2VASC score: 3 Anticoagulation history: Eliquis   Past Medical History:  Diagnosis Date   Anxiety    Asthma    Hypertension    No past surgical history on file.  Current Outpatient Medications  Medication Sig Dispense Refill   albuterol (VENTOLIN HFA) 108 (90 Base) MCG/ACT inhaler  Inhale 2 puffs into the lungs every 4 (four) hours as needed.     ALPRAZolam (XANAX) 0.25 MG tablet TAKE 1 TABLET 3 TIMES A DAY BY ORAL ROUTE.     amLODipine (NORVASC) 10 MG tablet Take 1 tablet by mouth daily.     apixaban (ELIQUIS) 5 MG TABS tablet Take 1 tablet (5 mg total) by mouth 2 (two) times daily. 60 tablet 1   escitalopram (LEXAPRO) 10 MG tablet Take 15 mg by mouth daily.     levothyroxine (SYNTHROID) 75 MCG tablet Take 1 tablet by mouth daily.     losartan (COZAAR) 100 MG tablet Take 1 tablet by mouth daily.     metoprolol tartrate (LOPRESSOR) 25 MG tablet Take 1 tablet (25 mg total) by mouth 2 (two) times daily. 60 tablet 2   montelukast (SINGULAIR) 10 MG tablet Take 10 mg by mouth daily.     Omega-3 Fatty Acids (FISH OIL OMEGA-3 PO) Take 2 capsules by mouth at bedtime.     Probiotic Product (PROBIOTIC PO) Take 1 tablet by mouth every morning.     No current facility-administered medications for this encounter.    Allergies  Allergen Reactions   Amoxicillin Other (See Comments)    Nervous   Diclofenac Other (See Comments)    Hives, rapid heart beat    Wheat Bran Other (See Comments)    Social History   Socioeconomic History   Marital status: Divorced    Spouse name: Not on file   Number of children: Not on file   Years of education: Not on file   Highest education level: Not on file  Occupational History  Not on file  Tobacco Use   Smoking status: Never   Smokeless tobacco: Never  Substance and Sexual Activity   Alcohol use: Yes   Drug use: Never   Sexual activity: Not on file  Other Topics Concern   Not on file  Social History Narrative   Not on file   Social Determinants of Health   Financial Resource Strain: Not on file  Food Insecurity: Not on file  Transportation Needs: Not on file  Physical Activity: Not on file  Stress: Not on file  Social Connections: Not on file  Intimate Partner Violence: Not on file     ROS- All systems are reviewed  and negative except as per the HPI above.  Physical Exam: Vitals:   02/23/22 1419  BP: 126/80  Pulse: 89  Weight: 75.1 kg  Height: 5\' 4"  (1.626 m)    GEN- The patient is a well appearing female, alert and oriented x 3 today.   Head- normocephalic, atraumatic Eyes-  Sclera clear, conjunctiva pink Ears- hearing intact Oropharynx- clear Neck- supple  Lungs- Clear to ausculation bilaterally, normal work of breathing Heart- irregular rate and rhythm, no murmurs, rubs or gallops  GI- soft, NT, ND, + BS Extremities- no clubbing, cyanosis, or edema MS- no significant deformity or atrophy Skin- no rash or lesion Psych- euthymic mood, full affect Neuro- strength and sensation are intact  Wt Readings from Last 3 Encounters:  02/23/22 75.1 kg  02/13/22 75.8 kg    EKG today demonstrates  Afib Vent. rate 89 BPM PR interval * ms QRS duration 82 ms QT/QTcB 366/445 ms   Epic records are reviewed at length today  CHA2DS2-VASc Score = 3  The patient's score is based upon: CHF History: 0 HTN History: 1 Diabetes History: 0 Stroke History: 0 Vascular Disease History: 0 Age Score: 1 Gender Score: 1       ASSESSMENT AND PLAN: 1. Persistent Atrial Fibrillation (ICD10:  I48.19) The patient's CHA2DS2-VASc score is 3, indicating a 3.2% annual risk of stroke.   General education about afib provided and questions answered. We also discussed her stroke risk and the risks and benefits of anticoagulation. COVID trigger? We discussed rhythm control options today. Will plan for DCCV after 3 weeks of uninterrupted anticoagulation.  Continue Lopressor 25 mg BID Continue Eliquis 5 mg BID Check echocardiogram  2. Secondary Hypercoagulable State (ICD10:  D68.69) The patient is at significant risk for stroke/thromboembolism based upon her CHA2DS2-VASc Score of 3.  Continue Apixaban (Eliquis).   3. HTN Stable, no changes today.    Follow up in the AF clinic post DCCV.    [facility] [address] [place], [place] [address] [phone] 02/23/2022 2:51 PM

## 2022-02-23 NOTE — Patient Instructions (Addendum)
Cardioversion scheduled for Tuesday November 21  - Arrive at the [facility] and go to admitting at 9:30am  - Do not eat or drink anything after midnight the night prior to your procedure.  - Take all your morning medication (except diabetic medications) with a sip of water prior to arrival.  - You will not be able to drive home after your procedure.  - Do NOT miss any doses of your blood thinner - if you should miss a dose please notify our office immediately.  - If you feel as if you go back into normal rhythm prior to scheduled cardioversion, please notify our office immediately. If your procedure is canceled in the cardioversion suite you will be charged a cancellation fee.

## 2022-02-24 ENCOUNTER — Other Ambulatory Visit (HOSPITAL_COMMUNITY): Payer: Self-pay | Admitting: *Deleted

## 2022-02-24 DIAGNOSIS — I4819 Other persistent atrial fibrillation: Secondary | ICD-10-CM

## 2022-02-25 ENCOUNTER — Ambulatory Visit (HOSPITAL_COMMUNITY)
Admission: RE | Admit: 2022-02-25 | Discharge: 2022-02-25 | Disposition: A | Discharge status: Home / Self Care | Payer: Medicare PPO | Source: Ambulatory Visit | Attending: Physician Assistant | Admitting: Physician Assistant

## 2022-02-25 DIAGNOSIS — I083 Combined rheumatic disorders of mitral, aortic and tricuspid valves: Secondary | ICD-10-CM | POA: Insufficient documentation

## 2022-02-25 DIAGNOSIS — I4819 Other persistent atrial fibrillation: Secondary | ICD-10-CM | POA: Diagnosis not present

## 2022-02-25 DIAGNOSIS — I10 Essential (primary) hypertension: Secondary | ICD-10-CM | POA: Insufficient documentation

## 2022-02-25 LAB — ECHOCARDIOGRAM COMPLETE
P 1/2 time: 496 msec
S' Lateral: 3.5 cm

## 2022-02-26 ENCOUNTER — Ambulatory Visit: Payer: TRICARE For Life (TFL)

## 2022-03-04 ENCOUNTER — Ambulatory Visit (HOSPITAL_BASED_OUTPATIENT_CLINIC_OR_DEPARTMENT_OTHER): Payer: TRICARE For Life (TFL)

## 2022-03-10 ENCOUNTER — Encounter (HOSPITAL_COMMUNITY)
Admission: RE | Disposition: A | Discharge status: Home / Self Care | Payer: Self-pay | Source: Home / Self Care | Attending: Cardiology

## 2022-03-10 ENCOUNTER — Encounter (HOSPITAL_COMMUNITY): Payer: Self-pay | Admitting: Cardiology

## 2022-03-10 ENCOUNTER — Other Ambulatory Visit (HOSPITAL_COMMUNITY): Payer: TRICARE For Life (TFL) | Admitting: Physician Assistant

## 2022-03-10 ENCOUNTER — Other Ambulatory Visit: Payer: Self-pay

## 2022-03-10 ENCOUNTER — Ambulatory Visit (HOSPITAL_COMMUNITY)
Admission: RE | Admit: 2022-03-10 | Discharge: 2022-03-10 | Disposition: A | Discharge status: Home / Self Care | Payer: Medicare PPO | Attending: Cardiology | Admitting: Cardiology

## 2022-03-10 ENCOUNTER — Ambulatory Visit (HOSPITAL_BASED_OUTPATIENT_CLINIC_OR_DEPARTMENT_OTHER): Payer: Medicare PPO | Admitting: Anesthesiology

## 2022-03-10 ENCOUNTER — Ambulatory Visit (HOSPITAL_COMMUNITY)
Admission: RE | Admit: 2022-03-10 | Discharge: 2022-03-10 | Disposition: A | Discharge status: Home / Self Care | Payer: Medicare PPO | Source: Ambulatory Visit | Attending: Physician Assistant | Admitting: Physician Assistant

## 2022-03-10 ENCOUNTER — Ambulatory Visit (HOSPITAL_COMMUNITY): Payer: Medicare PPO | Admitting: Anesthesiology

## 2022-03-10 DIAGNOSIS — F419 Anxiety disorder, unspecified: Secondary | ICD-10-CM

## 2022-03-10 DIAGNOSIS — D6869 Other thrombophilia: Secondary | ICD-10-CM | POA: Insufficient documentation

## 2022-03-10 DIAGNOSIS — I4819 Other persistent atrial fibrillation: Secondary | ICD-10-CM

## 2022-03-10 DIAGNOSIS — I4891 Unspecified atrial fibrillation: Secondary | ICD-10-CM | POA: Diagnosis not present

## 2022-03-10 DIAGNOSIS — J45909 Unspecified asthma, uncomplicated: Secondary | ICD-10-CM | POA: Insufficient documentation

## 2022-03-10 DIAGNOSIS — I10 Essential (primary) hypertension: Secondary | ICD-10-CM | POA: Diagnosis not present

## 2022-03-10 DIAGNOSIS — Z7901 Long term (current) use of anticoagulants: Secondary | ICD-10-CM | POA: Insufficient documentation

## 2022-03-10 DIAGNOSIS — Z79899 Other long term (current) drug therapy: Secondary | ICD-10-CM | POA: Insufficient documentation

## 2022-03-10 HISTORY — PX: CARDIOVERSION: SHX1299

## 2022-03-10 LAB — CBC
HCT: 40.3 % (ref 36.0–46.0)
Hemoglobin: 13.3 g/dL (ref 12.0–15.0)
MCH: 30.2 pg (ref 26.0–34.0)
MCHC: 33 g/dL (ref 30.0–36.0)
MCV: 91.6 fL (ref 80.0–100.0)
Platelets: 280 10*3/uL (ref 150–400)
RBC: 4.4 MIL/uL (ref 3.87–5.11)
RDW: 14 % (ref 11.5–15.5)
WBC: 4 10*3/uL (ref 4.0–10.5)
nRBC: 0 % (ref 0.0–0.2)

## 2022-03-10 LAB — BASIC METABOLIC PANEL
Anion gap: 9 (ref 5–15)
BUN: 15 mg/dL (ref 8–23)
CO2: 22 mmol/L (ref 22–32)
Calcium: 9.6 mg/dL (ref 8.9–10.3)
Chloride: 110 mmol/L (ref 98–111)
Creatinine, Ser: 0.82 mg/dL (ref 0.44–1.00)
GFR, Estimated: >60 mL/min (ref >60)
Glucose, Bld: 93 mg/dL (ref 70–99)
Potassium: 4.3 mmol/L (ref 3.5–5.1)
Sodium: 141 mmol/L (ref 135–145)

## 2022-03-10 SURGERY — CARDIOVERSION
Anesthesia: General

## 2022-03-10 MED ORDER — SODIUM CHLORIDE 0.9 % IV SOLN: INTRAVENOUS | Status: DC

## 2022-03-10 MED ORDER — PROPOFOL 10 MG/ML IV BOLUS
INTRAVENOUS | Status: DC | PRN
  Administered 2022-03-10: 70 mg via INTRAVENOUS
  Administered 2022-03-10: 30 mg via INTRAVENOUS

## 2022-03-10 MED ORDER — LIDOCAINE 2% (20 MG/ML) 5 ML SYRINGE
INTRAMUSCULAR | Status: DC | PRN
  Administered 2022-03-10: 80 mg via INTRAVENOUS

## 2022-03-10 MED ORDER — SODIUM CHLORIDE 0.9 % IV SOLN: INTRAVENOUS | Status: DC | PRN

## 2022-03-10 NOTE — Interval H&P Note (Signed)
History and Physical Interval Note:  03/10/2022 10:58 AM  [name]  has presented today for surgery, with the diagnosis of AFIB.  The various methods of treatment have been discussed with the patient and family. After consideration of risks, benefits and other options for treatment, the patient has consented to  Procedure(s): CARDIOVERSION (N/A) as a surgical intervention.  The patient's history has been reviewed, patient examined, no change in status, stable for surgery.  I have reviewed the patient's chart and labs.  Questions were answered to the patient's satisfaction.     [name]

## 2022-03-10 NOTE — Transfer of Care (Signed)
Immediate Anesthesia Transfer of Care Note  Patient: [name]  Procedure(s) Performed: CARDIOVERSION  Patient Location: PACU and Endoscopy Unit  Anesthesia Type:General  Level of Consciousness: awake, alert , and oriented  Airway & Oxygen Therapy: Patient Spontanous Breathing  Post-op Assessment: Report given to RN, Post -op Vital signs reviewed and stable, and Patient moving all extremities X 4  Post vital signs: Reviewed and stable  Last Vitals:  Vitals Value Taken Time  BP 100/73   Temp    Pulse 61   Resp 11   SpO2 96     Last Pain:  Vitals:   03/10/22 0918  TempSrc: Tympanic  PainSc: 0-No pain         Complications: No notable events documented.

## 2022-03-10 NOTE — Anesthesia Preprocedure Evaluation (Addendum)
Anesthesia Evaluation  Patient identified by MRN, date of birth, ID band Patient awake    Reviewed: Allergy & Precautions, NPO status , Patient's Chart, lab work & pertinent test results, reviewed documented beta blocker date and time   History of Anesthesia Complications Negative for: history of anesthetic complications  Airway Mallampati: II  TM Distance: >3 FB Neck ROM: Full    Dental no notable dental hx.    Pulmonary asthma    Pulmonary exam normal        Cardiovascular hypertension, Pt. on home beta blockers and Pt. on medications Normal cardiovascular exam+ dysrhythmias Atrial Fibrillation   EF 59%, mild LAE/RAE, mild MR, mild to moderate AR, borderline dilatation of the ascending aorta, measuring 51mm   Neuro/Psych   Anxiety     negative neurological ROS     GI/Hepatic negative GI ROS, Neg liver ROS,,,  Endo/Other  negative endocrine ROS    Renal/GU negative Renal ROS  negative genitourinary   Musculoskeletal negative musculoskeletal ROS (+)    Abdominal   Peds  Hematology negative hematology ROS (+)   Anesthesia Other Findings Day of surgery medications reviewed with patient.  Reproductive/Obstetrics negative OB ROS                             Anesthesia Physical Anesthesia Plan  ASA: 3  Anesthesia Plan: General   Post-op Pain Management: Minimal or no pain anticipated   Induction: Intravenous  PONV Risk Score and Plan: Treatment may vary due to age or medical condition and Propofol infusion  Airway Management Planned: Mask  Additional Equipment: None  Intra-op Plan:   Post-operative Plan:   Informed Consent: I have reviewed the patients History and Physical, chart, labs and discussed the procedure including the risks, benefits and alternatives for the proposed anesthesia with the patient or authorized representative who has indicated his/her understanding and  acceptance.       Plan Discussed with: CRNA  Anesthesia Plan Comments:         Anesthesia Quick Evaluation

## 2022-03-10 NOTE — Anesthesia Postprocedure Evaluation (Signed)
Anesthesia Post Note  Patient: [name]  Procedure(s) Performed: CARDIOVERSION     Patient location during evaluation: PACU Anesthesia Type: General Level of consciousness: awake and alert Pain management: pain level controlled Vital Signs Assessment: post-procedure vital signs reviewed and stable Respiratory status: spontaneous breathing, nonlabored ventilation and respiratory function stable Cardiovascular status: blood pressure returned to baseline Postop Assessment: no apparent nausea or vomiting Anesthetic complications: no   No notable events documented.  Last Vitals:  Vitals:   03/10/22 0918 03/10/22 1110  BP: (!) 144/98 100/73  Pulse:  60  Resp: 19 15  Temp: 36.4 C   SpO2: 97% 95%    Last Pain:  Vitals:   03/10/22 1110  TempSrc: Temporal  PainSc: 0-No pain                 Shanda Howells

## 2022-03-10 NOTE — CV Procedure (Signed)
    Electrical Cardioversion Procedure Note [name] [id number] [birth year]  Procedure: Electrical Cardioversion Indications:  Atrial Fibrillation  Time Out: Verified patient identification, verified procedure,medications/allergies/relevent history reviewed, required imaging and test results available.  Performed  Procedure Details  The patient was NPO after midnight. Anesthesia was administered at the beside  by Dr.Howse with 60 mg of Lidocaine and 100mg  of propofol.  Cardioversion was done with synchronized biphasic defibrillation with AP pads with 150watts.  The patient converted to normal sinus rhythm. The patient tolerated the procedure well   IMPRESSION:  Successful cardioversion of atrial fibrillation    [name] 03/10/2022, 10:59 AM

## 2022-03-10 NOTE — Discharge Instructions (Signed)

## 2022-03-13 ENCOUNTER — Encounter (HOSPITAL_COMMUNITY): Payer: Self-pay | Admitting: Cardiology

## 2022-03-17 ENCOUNTER — Ambulatory Visit (HOSPITAL_COMMUNITY)
Admission: RE | Admit: 2022-03-17 | Discharge: 2022-03-17 | Disposition: A | Discharge status: Home / Self Care | Payer: Medicare PPO | Source: Ambulatory Visit | Attending: Physician Assistant | Admitting: Physician Assistant

## 2022-03-17 ENCOUNTER — Encounter (HOSPITAL_COMMUNITY): Payer: Self-pay | Admitting: Physician Assistant

## 2022-03-17 VITALS — BP 122/78 | HR 58 | Ht 64.0 in | Wt 166.2 lb

## 2022-03-17 DIAGNOSIS — I38 Endocarditis, valve unspecified: Secondary | ICD-10-CM | POA: Diagnosis not present

## 2022-03-17 DIAGNOSIS — Z7901 Long term (current) use of anticoagulants: Secondary | ICD-10-CM | POA: Insufficient documentation

## 2022-03-17 DIAGNOSIS — I4819 Other persistent atrial fibrillation: Secondary | ICD-10-CM | POA: Insufficient documentation

## 2022-03-17 DIAGNOSIS — Z79899 Other long term (current) drug therapy: Secondary | ICD-10-CM | POA: Diagnosis not present

## 2022-03-17 DIAGNOSIS — D6869 Other thrombophilia: Secondary | ICD-10-CM | POA: Insufficient documentation

## 2022-03-17 DIAGNOSIS — I10 Essential (primary) hypertension: Secondary | ICD-10-CM | POA: Insufficient documentation

## 2022-03-17 DIAGNOSIS — J45909 Unspecified asthma, uncomplicated: Secondary | ICD-10-CM | POA: Diagnosis not present

## 2022-03-17 MED ORDER — METOPROLOL TARTRATE 25 MG PO TABS: 12.5000 mg | ORAL_TABLET | Freq: Two times a day (BID) | ORAL | 3 refills | Status: DC

## 2022-03-17 MED ORDER — APIXABAN 5 MG PO TABS: 5.0000 mg | ORAL_TABLET | Freq: Two times a day (BID) | ORAL | 4 refills | Status: DC

## 2022-03-17 NOTE — Patient Instructions (Signed)
Decrease metoprolol to 1/2 tablet twice a day  

## 2022-03-17 NOTE — Progress Notes (Signed)
Primary Care Physician: Saintclair Halsted, FNP Primary Cardiologist: none Primary Electrophysiologist: none Referring Physician: [facility]   [name] is a 66 y.o. female with a history of HTN, asthma, atrial fibrillation who presents for follow up in the [facility].  The patient was initially diagnosed with atrial fibrillation 02/13/22 after presenting to the ED with symptoms of palpitations. Her smart watch showed elevated heart rates in the 120s at rest. ECG showed afib with RVR and she was started on metoprolol for rate control and Eliquis for a CHADS2VASC score of 3. Of note, she was diagnosed with COVID the month prior to the onset of her afib.  On follow up today, patient is s/p DCCV on 03/10/22. Patient remains in [place] today. She has not had any further sustained elevated heart rates on her smart watch. She has noted more fatigue since starting metoprolol and Eliquis.   Today, she denies symptoms of palpitations, chest pain, shortness of breath, orthopnea, PND, lower extremity edema, dizziness, presyncope, syncope, snoring, daytime somnolence, bleeding, or neurologic sequela. The patient is tolerating medications without difficulties and is otherwise without complaint today.    Atrial Fibrillation Risk Factors:  she does not have symptoms or diagnosis of sleep apnea. she does not have a history of rheumatic fever. she does have a history of alcohol use. The patient does not have a history of early familial atrial fibrillation or other arrhythmias.  she has a BMI of Body mass index is 28.53 kg/m.Marland Kitchen Filed Weights   03/17/22 1459  Weight: 75.4 kg     No family history on file.   Atrial Fibrillation Management history:  Previous antiarrhythmic drugs: none Previous cardioversions: 03/10/22 Previous ablations: none CHADS2VASC score: 3 Anticoagulation history: Eliquis   Past Medical History:  Diagnosis Date   Anxiety    Asthma     Hypertension    Past Surgical History:  Procedure Laterality Date   CARDIOVERSION N/A 03/10/2022   Procedure: CARDIOVERSION;  Surgeon: Sueanne Margarita, MD;  Location: [facility] ENDOSCOPY;  Service: Cardiovascular;  Laterality: N/A;    Current Outpatient Medications  Medication Sig Dispense Refill   albuterol (VENTOLIN HFA) 108 (90 Base) MCG/ACT inhaler Inhale 2 puffs into the lungs every 4 (four) hours as needed for shortness of breath.     ALPRAZolam (XANAX) 0.25 MG tablet Take 0.25 mg by mouth daily as needed for anxiety.     amLODipine (NORVASC) 10 MG tablet Take 10 mg by mouth daily.     apixaban (ELIQUIS) 5 MG TABS tablet Take 1 tablet (5 mg total) by mouth 2 (two) times daily. 60 tablet 1   Carboxymethylcellul-Glycerin (LUBRICATING EYE DROPS OP) Place 1 drop into both eyes daily as needed (dry eyes).     Coenzyme Q10-Red Yeast Rice (CO Q-10 PLUS RED YEAST RICE PO) Take 2 tablets by mouth daily. With niacin     escitalopram (LEXAPRO) 10 MG tablet Take 10 mg by mouth daily. Take with 5 mg to equal 15 mg daily     escitalopram (LEXAPRO) 5 MG tablet Take 5 mg by mouth daily. Take with 10 mg to equal 15 mg daily     levothyroxine (SYNTHROID) 75 MCG tablet Take 1 tablet by mouth daily.     losartan (COZAAR) 100 MG tablet Take 1 tablet by mouth daily.     metoprolol tartrate (LOPRESSOR) 25 MG tablet Take 1 tablet (25 mg total) by mouth 2 (two) times daily. 60 tablet 2  montelukast (SINGULAIR) 10 MG tablet Take 10 mg by mouth daily.     Multiple Vitamin (MULTIVITAMIN WITH MINERALS) TABS tablet Take 1 tablet by mouth daily.     Omega-3 Fatty Acids (FISH OIL OMEGA-3 PO) Take 2 capsules by mouth at bedtime.     omeprazole (PRILOSEC OTC) 20 MG tablet Take 20 mg by mouth daily as needed (acid reflux).     Probiotic Product (PROBIOTIC PO) Take 1 tablet by mouth every morning.     No current facility-administered medications for this encounter.    Allergies  Allergen Reactions   Amoxicillin Other  (See Comments)    Nervous   Diclofenac Hives    rapid heart beat     Social History   Socioeconomic History   Marital status: Divorced    Spouse name: Not on file   Number of children: Not on file   Years of education: Not on file   Highest education level: Not on file  Occupational History   Not on file  Tobacco Use   Smoking status: Former    Types: Cigarettes   Smokeless tobacco: Never   Tobacco comments:    Former smoker 03/17/22  Substance and Sexual Activity   Alcohol use: Yes    Alcohol/week: 12.0 standard drinks of alcohol    Types: 12 Standard drinks or equivalent per week    Comment: 1-2 glasses of wine nightly 03/17/22   Drug use: Never   Sexual activity: Not on file  Other Topics Concern   Not on file  Social History Narrative   Not on file   Social Determinants of Health   Financial Resource Strain: Not on file  Food Insecurity: Not on file  Transportation Needs: Not on file  Physical Activity: Not on file  Stress: Not on file  Social Connections: Not on file  Intimate Partner Violence: Not on file     ROS- All systems are reviewed and negative except as per the HPI above.  Physical Exam: Vitals:   03/17/22 1459  BP: 122/78  Pulse: (!) 58  Weight: 75.4 kg  Height: 5\' 4"  (1.626 m)     GEN- The patient is a well appearing female, alert and oriented x 3 today.   HEENT-head normocephalic, atraumatic, sclera clear, conjunctiva pink, hearing intact, trachea midline. Lungs- Clear to ausculation bilaterally, normal work of breathing Heart- Regular rate and rhythm, no murmurs, rubs or gallops  GI- soft, NT, ND, + BS Extremities- no clubbing, cyanosis, or edema MS- no significant deformity or atrophy Skin- no rash or lesion Psych- euthymic mood, full affect Neuro- strength and sensation are intact   Wt Readings from Last 3 Encounters:  03/17/22 75.4 kg  03/10/22 72.6 kg  02/23/22 75.1 kg    EKG today demonstrates  SB, 1st degree AV  block Vent. rate 58 BPM PR interval 208 ms QRS duration 86 ms QT/QTcB 438/429 ms  Echo 02/25/22 1. Left ventricular ejection fraction by 3D volume is 59 %. The left  ventricle has normal function. The left ventricle has no regional wall  motion abnormalities. Left ventricular diastolic parameters are  indeterminate.   2. Right ventricular systolic function is normal. The right ventricular  size is normal. Tricuspid regurgitation signal is inadequate for assessing  PA pressure.   3. Left atrial size was mildly dilated.   4. Right atrial size was mildly dilated.   5. The mitral valve is normal in structure. Mild mitral valve  regurgitation. No evidence of mitral stenosis.  6. The aortic valve is normal in structure. Aortic valve regurgitation is  mild to moderate. No aortic stenosis is present.   7. There is borderline dilatation of the ascending aorta, measuring 37  mm.   8. The inferior vena cava is dilated in size with >50% respiratory  variability, suggesting right atrial pressure of 8 mmHg.    Epic records are reviewed at length today  CHA2DS2-VASc Score = 3  The patient's score is based upon: CHF History: 0 HTN History: 1 Diabetes History: 0 Stroke History: 0 Vascular Disease History: 0 Age Score: 1 Gender Score: 1       ASSESSMENT AND PLAN: 1. Persistent Atrial Fibrillation (ICD10:  I48.19) The patient's CHA2DS2-VASc score is 3, indicating a 3.2% annual risk of stroke.   S/p DCCV on 03/10/22 Patient appears to be maintaining SR.  Will decrease Lopressor to 12.5 mg BID to see if fatigue improves.  Continue Eliquis 5 mg BID  2. Secondary Hypercoagulable State (ICD10:  D68.69) The patient is at significant risk for stroke/thromboembolism based upon her CHA2DS2-VASc Score of 3.  Continue Apixaban (Eliquis).   3. HTN Stable, med changes as above.   4. 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 Follow up in the AF clinic in 6 months.     [facility] [address] [place], [place] [address] [phone] 03/17/2022 3:14 PM

## 2022-03-23 ENCOUNTER — Ambulatory Visit
Admission: RE | Admit: 2022-03-23 | Discharge: 2022-03-23 | Disposition: A | Discharge status: Home / Self Care | Payer: TRICARE For Life (TFL) | Source: Ambulatory Visit | Attending: Family Medicine | Admitting: Family Medicine

## 2022-03-23 DIAGNOSIS — Z1231 Encounter for screening mammogram for malignant neoplasm of breast: Secondary | ICD-10-CM

## 2022-04-09 ENCOUNTER — Ambulatory Visit (HOSPITAL_BASED_OUTPATIENT_CLINIC_OR_DEPARTMENT_OTHER)
Admission: RE | Admit: 2022-04-09 | Discharge: 2022-04-09 | Disposition: A | Discharge status: Home / Self Care | Payer: TRICARE For Life (TFL) | Source: Ambulatory Visit | Attending: Family Medicine | Admitting: Family Medicine

## 2022-04-09 DIAGNOSIS — E7800 Pure hypercholesterolemia, unspecified: Secondary | ICD-10-CM | POA: Insufficient documentation

## 2022-04-28 ENCOUNTER — Other Ambulatory Visit: Payer: Self-pay | Admitting: Family Medicine

## 2022-04-28 DIAGNOSIS — K769 Liver disease, unspecified: Secondary | ICD-10-CM

## 2022-05-18 ENCOUNTER — Telehealth: Payer: Self-pay | Admitting: *Deleted

## 2022-05-18 ENCOUNTER — Encounter: Payer: Self-pay | Admitting: Cardiology

## 2022-05-18 ENCOUNTER — Ambulatory Visit: Payer: Medicare PPO | Attending: Cardiology | Admitting: Cardiology

## 2022-05-18 VITALS — BP 130/66 | HR 63 | Ht 64.0 in | Wt 167.0 lb

## 2022-05-18 DIAGNOSIS — I4819 Other persistent atrial fibrillation: Secondary | ICD-10-CM

## 2022-05-18 DIAGNOSIS — I351 Nonrheumatic aortic (valve) insufficiency: Secondary | ICD-10-CM

## 2022-05-18 DIAGNOSIS — E785 Hyperlipidemia, unspecified: Secondary | ICD-10-CM

## 2022-05-18 DIAGNOSIS — R0683 Snoring: Secondary | ICD-10-CM

## 2022-05-18 DIAGNOSIS — I10 Essential (primary) hypertension: Secondary | ICD-10-CM | POA: Diagnosis not present

## 2022-05-18 DIAGNOSIS — R931 Abnormal findings on diagnostic imaging of heart and coronary circulation: Secondary | ICD-10-CM | POA: Diagnosis not present

## 2022-05-18 DIAGNOSIS — I480 Paroxysmal atrial fibrillation: Secondary | ICD-10-CM | POA: Diagnosis not present

## 2022-05-18 MED ORDER — METOPROLOL SUCCINATE ER 25 MG PO TB24: 25.0000 mg | ORAL_TABLET | Freq: Every day | ORAL | 3 refills | Status: DC

## 2022-05-18 NOTE — Progress Notes (Signed)
Cardiology Note    Date:  05/18/2022   ID:  [name], DOB February 25, 1956, MRN [id number]  PCP:  Saintclair Halsted, FNP  Cardiologist:  Fransico Him, MD   Chief Complaint  Patient presents with   Atrial Fibrillation   Hypertension    History of Present Illness:  [name] is a 67 y.o. female with a history of anxiety, asthma and hypertension.  She was initially seen in the ER 02/13/2022 with palpitations and was diagnosed with atrial fibrillation.  She was started on metoprolol for rate control and Eliquis for CHADS2 Vascor 3.  She was diagnosed with COVID a month prior to that.    She was then referred to our A-fib clinic.  2D echo 02/25/2022 showed normal LV function with EF 59% with mild biatrial enlargement, mild MR and mild to moderate AR.  On 03/10/2022 she underwent cardioversion to sinus rhythm.  She is now just for to establish general cardiac care  She is here today for followup and is doing well.  She has chronic DOE when running but only lasts 10 minutes and then resolves as she keeps running. She denies any chest pain or pressure, PND, orthopnea, LE edema, dizziness, palpitations or syncope. She is compliant with her meds and is tolerating meds with no SE.    Past Medical History:  Diagnosis Date   Agatston coronary artery calcium score less than 100    coronary Ca score 29   Anxiety    Aortic insufficiency    mild to moderate by echo 02/2022   Asthma    Hyperlipidemia LDL goal <70    Hypertension    PAF (paroxysmal atrial fibrillation) ([place])     Past Surgical History:  Procedure Laterality Date   CARDIOVERSION N/A 03/10/2022   Procedure: CARDIOVERSION;  Surgeon: Sueanne Margarita, MD;  Location: [facility];  Service: Cardiovascular;  Laterality: N/A;    Current Medications: Current Meds  Medication Sig   albuterol (VENTOLIN HFA) 108 (90 Base) MCG/ACT inhaler Inhale 2 puffs into the lungs every 4 (four) hours as needed for shortness of breath.    ALPRAZolam (XANAX) 0.25 MG tablet Take 0.25 mg by mouth daily as needed for anxiety.   amLODipine (NORVASC) 10 MG tablet Take 10 mg by mouth daily.   escitalopram (LEXAPRO) 10 MG tablet Take 10 mg by mouth daily. Take with 5 mg to equal 15 mg daily   escitalopram (LEXAPRO) 5 MG tablet Take 5 mg by mouth daily. Take with 10 mg to equal 15 mg daily   levothyroxine (SYNTHROID) 75 MCG tablet Take 1 tablet by mouth daily.   losartan (COZAAR) 100 MG tablet Take 1 tablet by mouth daily.   Multiple Vitamin (MULTIVITAMIN WITH MINERALS) TABS tablet Take 1 tablet by mouth daily.   Omega-3 Fatty Acids (FISH OIL OMEGA-3 PO) Take 2 capsules by mouth at bedtime.   omeprazole (PRILOSEC OTC) 20 MG tablet Take 20 mg by mouth daily as needed (acid reflux).   Probiotic Product (PROBIOTIC PO) Take 1 tablet by mouth every morning.   rosuvastatin (CRESTOR) 10 MG tablet 1 tablet Orally Once a day for 90 days    Allergies:   Amoxicillin and Diclofenac   Social History   Socioeconomic History   Marital status: Divorced    Spouse name: Not on file   Number of children: Not on file   Years of education: Not on file   Highest education level: Not on file  Occupational History  Not on file  Tobacco Use   Smoking status: Former    Types: Cigarettes   Smokeless tobacco: Never   Tobacco comments:    Former smoker 03/17/22  Substance and Sexual Activity   Alcohol use: Yes    Alcohol/week: 12.0 standard drinks of alcohol    Types: 12 Standard drinks or equivalent per week    Comment: 1-2 glasses of wine nightly 03/17/22   Drug use: Never   Sexual activity: Not on file  Other Topics Concern   Not on file  Social History Narrative   Not on file   Social Determinants of Health   Financial Resource Strain: Not on file  Food Insecurity: Not on file  Transportation Needs: Not on file  Physical Activity: Not on file  Stress: Not on file  Social Connections: Not on file     Family History:  The patient's  family history is not on file.   ROS:   Please see the history of present illness.    ROS All other systems reviewed and are negative.      No data to display             PHYSICAL EXAM:   VS:  BP 130/66   Pulse 63   Ht 5\' 4"  (1.626 m)   Wt 167 lb (75.8 kg)   LMP 09/21/2010   SpO2 96%   BMI 28.67 kg/m    GEN: Well nourished, well developed, in no acute distress  HEENT: normal  Neck: no JVD, carotid bruits, or masses Cardiac: RRR; no murmurs, rubs, or gallops,no edema.  Intact distal pulses bilaterally.  Respiratory:  clear to auscultation bilaterally, normal work of breathing GI: soft, nontender, nondistended, + BS MS: no deformity or atrophy  Skin: warm and dry, no rash Neuro:  Alert and Oriented x 3, Strength and sensation are intact Psych: euthymic mood, full affect  Wt Readings from Last 3 Encounters:  05/18/22 167 lb (75.8 kg)  03/17/22 166 lb 3.2 oz (75.4 kg)  03/10/22 160 lb (72.6 kg)      Studies/Labs Reviewed:   EKG:  EKG is not ordered today.    Recent Labs: 02/13/2022: Magnesium 1.8; TSH 1.564 03/10/2022: BUN 15; Creatinine, Ser 0.82; Hemoglobin 13.3; Platelets 280; Potassium 4.3; Sodium 141   Lipid Panel No results found for: "CHOL", "TRIG", "HDL", "CHOLHDL", "VLDL", "LDLCALC", "LDLDIRECT"   CHA2DS2-VASc Score = 3   This indicates a 3.2% annual risk of stroke. The patient's score is based upon: CHF History: 0 HTN History: 1 Diabetes History: 0 Stroke History: 0 Vascular Disease History: 0 Age Score: 1 Gender Score: 1      Additional studies/ records that were reviewed today include:  Office visit notes from     ASSESSMENT:    1. PAF (paroxysmal atrial fibrillation) (HCC)   2. Primary hypertension   3. Nonrheumatic aortic valve insufficiency   4. Agatston coronary artery calcium score less than 100   5. Hyperlipidemia LDL goal <70      PLAN:  In order of problems listed above:  Paroxysmal atrial fibrillation -Status  post DCCV 02/25/2022 -She continues to maintain normal sinus rhythm and denies any palpitations -Continue prescription drug management with apixaban 5 mg twice daily  -I will change her metoprolol succinate to Toprol Xl 25mg  daily at bedtime since it makes her tired -I have personally reviewed and interpreted outside labs performed by patient's PCP which showed SCr 0.82 and K+ 4.3 and Hbg 13.3 in Nov 23 -  Check home sleep study to rule out sleep apnea that could be driving A-fib>>she does snore on occasion  2.  Hypertension -BP controlled on exam -Continue prescription drug management with amlodipine 10 mg daily, losartan 100 mg daily, Toprol XL 25mg  daily with as needed refills  3.  Aortic insufficiency -Mild to moderate on 2D echo 02/25/2022 -Repeat echo in 1 year  4.  Coronary artery calcifications -coronary Ca score is 29.4 in the LAD -denies any anginal sx -no ASA due to DOAC -continue statin  5.  HLD -LDL goal < 70 -continue prescription drug management with rosuvastatin 10mg  daily -PCP is following   Time Spent: 20 minutes total time of encounter, including 15 minutes spent in face-to-face patient care on the date of this encounter. This time includes coordination of care and counseling regarding above mentioned problem list. Remainder of non-face-to-face time involved reviewing chart documents/testing relevant to the patient encounter and documentation in the medical record. I have independently reviewed documentation from referring provider  Medication Adjustments/Labs and Tests Ordered: Current medicines are reviewed at length with the patient today.  Concerns regarding medicines are outlined above.  Medication changes, Labs and Tests ordered today are listed in the Patient Instructions below.  There are no Patient Instructions on file for this visit.   Signed, Fransico Him, MD  05/18/2022 1:51 PM    [facility] [place], [place], [place]   [address] Phone: [phone]; Fax: [phone]

## 2022-05-18 NOTE — Telephone Encounter (Signed)
Prior Authorization for ITAMAR sent to [employer] via Phone. Reference # .  READY- NO PA REQ 

## 2022-05-18 NOTE — Telephone Encounter (Signed)
Called and made the patient aware that she may proceed with the [facility] Sleep Study. PIN # provided to the patient. Patient made aware that she will be contacted after the test has been read with the results and any recommendations. Patient verbalized understanding and thanked me for the call.   Pt has been given PIN# [id number]. Pt will do sleep study one night this week.

## 2022-05-18 NOTE — Addendum Note (Signed)
Addended by: Joni Reining on: 05/18/2022 02:02 PM   Modules accepted: Orders

## 2022-05-18 NOTE — Patient Instructions (Addendum)
Medication Instructions:  STOP taking metoprolol TARTRATE 12.5 mg twice a day. START taking TOPROL XL 25 mg daily at bedtime.  *If you need a refill on your cardiac medications before your next appointment, please call your pharmacy*   Lab Work: None.  If you have labs (blood work) drawn today and your tests are completely normal, you will receive your results only by: [place] (if you have MyChart) OR A paper copy in the mail If you have any lab test that is abnormal or we need to change your treatment, we will call you to review the results.   Testing/Procedures: Your physician has requested that you have an echocardiogram in November of 2024. Echocardiography is a painless test that uses sound waves to create images of your heart. It provides your doctor with information about the size and shape of your heart and how well your heart's chambers and valves are working. This procedure takes approximately one hour. There are no restrictions for this procedure. Please do NOT wear cologne, perfume, aftershave, or lotions (deodorant is allowed). Please arrive 15 minutes prior to your appointment time.  Your physician has recommended that you have a sleep study. This test records several body functions during sleep, including: brain activity, eye movement, oxygen and carbon dioxide blood levels, heart rate and rhythm, breathing rate and rhythm, the flow of air through your mouth and nose, snoring, body muscle movements, and chest and belly movement.     Follow-Up: At [facility], you and your health needs are our priority.  As part of our continuing mission to provide you with exceptional heart care, we have created designated Provider Care Teams.  These Care Teams include your primary Cardiologist (physician) and Advanced Practice Providers (APPs -  Physician Assistants and Nurse Practitioners) who all work together to provide you with the care you need, when you need it.  We  recommend signing up for the patient portal called "MyChart".  Sign up information is provided on this After Visit Summary.  MyChart is used to connect with patients for Virtual Visits (Telemedicine).  Patients are able to view lab/test results, encounter notes, upcoming appointments, etc.  Non-urgent messages can be sent to your provider as well.   To learn more about what you can do with MyChart, go to [email or web address].    Your next appointment:   1 year(s)  Provider:   Dr. Fransico Him, MD

## 2022-05-25 ENCOUNTER — Encounter (INDEPENDENT_AMBULATORY_CARE_PROVIDER_SITE_OTHER): Payer: Medicare PPO | Admitting: Cardiology

## 2022-05-25 DIAGNOSIS — G4733 Obstructive sleep apnea (adult) (pediatric): Secondary | ICD-10-CM | POA: Diagnosis not present

## 2022-05-26 ENCOUNTER — Other Ambulatory Visit: Payer: TRICARE For Life (TFL)

## 2022-05-26 ENCOUNTER — Ambulatory Visit
Admission: RE | Admit: 2022-05-26 | Discharge: 2022-05-26 | Disposition: A | Discharge status: Home / Self Care | Payer: Medicare PPO | Source: Ambulatory Visit | Attending: Family Medicine | Admitting: Family Medicine

## 2022-05-26 DIAGNOSIS — K769 Liver disease, unspecified: Secondary | ICD-10-CM

## 2022-05-26 DIAGNOSIS — K760 Fatty (change of) liver, not elsewhere classified: Secondary | ICD-10-CM | POA: Diagnosis not present

## 2022-05-26 DIAGNOSIS — K7689 Other specified diseases of liver: Secondary | ICD-10-CM | POA: Diagnosis not present

## 2022-05-26 MED ORDER — GADOPICLENOL 0.5 MMOL/ML IV SOLN: 7.5000 mL | Freq: Once | INTRAVENOUS | Status: DC | PRN

## 2022-05-26 MED ORDER — GADOPICLENOL 0.5 MMOL/ML IV SOLN
7.5000 mL | Freq: Once | INTRAVENOUS | Status: AC | PRN
  Administered 2022-05-26: 7.5 mL via INTRAVENOUS

## 2022-05-27 ENCOUNTER — Ambulatory Visit: Payer: Medicare PPO | Attending: Cardiology

## 2022-05-27 DIAGNOSIS — R0683 Snoring: Secondary | ICD-10-CM

## 2022-05-27 DIAGNOSIS — I480 Paroxysmal atrial fibrillation: Secondary | ICD-10-CM

## 2022-05-27 NOTE — Procedures (Signed)
   SLEEP STUDY REPORT Patient Information Study Date: 05/25/2022 Patient Name: [name] Patient ID: [id number] Birth Date: [birth year] Age: 67 Gender: Female BMI: 28.6 (W=167 lb, H=5' 4'') Stopbang: 3 Referring Physician: Fransico Him, MD  TEST DESCRIPTION:  Home sleep apnea testing was completed using the WatchPat, a Type 1 device, utilizing peripheral arterial tonometry (PAT), chest movement, actigraphy, pulse oximetry, pulse rate, body position and snore.  AHI was calculated with apnea and hypopnea using valid sleep time as the denominator. RDI includes apneas, hypopneas, and RERAs.  The data acquired and the scoring of sleep and all associated events were performed in accordance with the recommended standards and specifications as outlined in the AASM Manual for the Scoring of Sleep and Associated Events 2.2.0 (2015).  FINDINGS:  1.  Moderate Obstructive Sleep Apnea with AHI 25.9/hr.   2.  No Central Sleep Apnea with pAHIc 0.4/hr.  3.  Oxygen desaturations as low as 76%.  4.  Severe snoring was present. O2 sats were < 88% for 85.5 min.  5.  Total sleep time was 2 hrs and 37 min.  6.  25.2% of total sleep time was spent in REM sleep.   7.  Normal sleep onset latency at 10 min  8.  Shortened REM sleep onset latency at 39 min.   9.  Total awakenings were 4.  10. Arrhythmia detection:  None.  DIAGNOSIS:   Moderate Obstructive Sleep Apnea (G47.33) Nocturnal Hypoxemia  RECOMMENDATIONS:   1.  Clinical correlation of these findings is necessary.  The decision to treat obstructive sleep apnea (OSA) is usually based on the presence of apnea symptoms or the presence of associated medical conditions such as Hypertension, Congestive Heart Failure, Atrial Fibrillation or Obesity.  The most common symptoms of OSA are snoring, gasping for breath while sleeping, daytime sleepiness and fatigue.   2.  Initiating apnea therapy is recommended given the presence of symptoms and/or associated  conditions. Recommend proceeding with one of the following:     a.  Auto-CPAP therapy with a pressure range of 5-20cm H2O.     b.  An oral appliance (OA) that can be obtained from certain dentists with expertise in sleep medicine.  These are primarily of use in non-obese patients with mild and moderate disease.     c.  An ENT consultation which may be useful to look for specific causes of obstruction and possible treatment options.     d.  If patient is intolerant to PAP therapy, consider referral to ENT for evaluation for hypoglossal nerve stimulator.

## 2022-06-26 ENCOUNTER — Telehealth: Payer: Self-pay | Admitting: *Deleted

## 2022-06-26 NOTE — Telephone Encounter (Signed)
-----   Message from Lauralee Evener, [place] sent at 05/28/2022 10:07 AM EST -----  ----- Message ----- From: Sueanne Margarita, MD Sent: 05/27/2022   9:19 AM EST To: [facility] Sleep Studies  Please let patient know that they have sleep apnea.  Recommend therapeutic CPAP titration for treatment of patient's sleep disordered breathing.  If unable to perform an in lab titration then initiate ResMed auto CPAP from 4 to 15cm H2O with heated humidity and mask of choice and overnight pulse ox on CPAP.

## 2022-06-26 NOTE — Telephone Encounter (Signed)
The patient has been notified of the result. Left detailed message on voicemail and informed patient to call back..Sylvia Kondracki Green, CMA   

## 2022-07-20 NOTE — Telephone Encounter (Signed)
The patient has been notified of the result and verbalized understanding.  All questions (if any) were answered. [name], [place] 07/20/2022 5:31 PM    Patient wants to speak to her doctor at her appointment on Thursday before deciding whether to test again or not. Patient will call our office back with her decision.

## 2022-07-23 DIAGNOSIS — I480 Paroxysmal atrial fibrillation: Secondary | ICD-10-CM | POA: Diagnosis not present

## 2022-07-23 DIAGNOSIS — F419 Anxiety disorder, unspecified: Secondary | ICD-10-CM | POA: Diagnosis not present

## 2022-07-23 DIAGNOSIS — E559 Vitamin D deficiency, unspecified: Secondary | ICD-10-CM | POA: Diagnosis not present

## 2022-07-23 DIAGNOSIS — G4733 Obstructive sleep apnea (adult) (pediatric): Secondary | ICD-10-CM | POA: Diagnosis not present

## 2022-07-23 DIAGNOSIS — E039 Hypothyroidism, unspecified: Secondary | ICD-10-CM | POA: Diagnosis not present

## 2022-07-23 DIAGNOSIS — E785 Hyperlipidemia, unspecified: Secondary | ICD-10-CM | POA: Diagnosis not present

## 2022-07-23 DIAGNOSIS — D6869 Other thrombophilia: Secondary | ICD-10-CM | POA: Diagnosis not present

## 2022-07-24 DIAGNOSIS — E039 Hypothyroidism, unspecified: Secondary | ICD-10-CM | POA: Diagnosis not present

## 2022-07-24 DIAGNOSIS — E785 Hyperlipidemia, unspecified: Secondary | ICD-10-CM | POA: Diagnosis not present

## 2022-07-24 DIAGNOSIS — E559 Vitamin D deficiency, unspecified: Secondary | ICD-10-CM | POA: Diagnosis not present

## 2022-07-24 DIAGNOSIS — I480 Paroxysmal atrial fibrillation: Secondary | ICD-10-CM | POA: Diagnosis not present

## 2022-08-19 ENCOUNTER — Other Ambulatory Visit (HOSPITAL_COMMUNITY): Payer: Self-pay | Admitting: Physician Assistant

## 2022-08-24 DIAGNOSIS — Z86018 Personal history of other benign neoplasm: Secondary | ICD-10-CM | POA: Diagnosis not present

## 2022-08-24 DIAGNOSIS — L814 Other melanin hyperpigmentation: Secondary | ICD-10-CM | POA: Diagnosis not present

## 2022-08-24 DIAGNOSIS — D225 Melanocytic nevi of trunk: Secondary | ICD-10-CM | POA: Diagnosis not present

## 2022-08-24 DIAGNOSIS — Z85828 Personal history of other malignant neoplasm of skin: Secondary | ICD-10-CM | POA: Diagnosis not present

## 2022-08-24 DIAGNOSIS — L821 Other seborrheic keratosis: Secondary | ICD-10-CM | POA: Diagnosis not present

## 2022-08-24 DIAGNOSIS — L578 Other skin changes due to chronic exposure to nonionizing radiation: Secondary | ICD-10-CM | POA: Diagnosis not present

## 2022-09-15 ENCOUNTER — Ambulatory Visit (HOSPITAL_COMMUNITY)
Admission: RE | Admit: 2022-09-15 | Discharge: 2022-09-15 | Disposition: A | Discharge status: Home / Self Care | Payer: Medicare PPO | Source: Ambulatory Visit | Attending: Physician Assistant | Admitting: Physician Assistant

## 2022-09-15 VITALS — BP 140/76 | HR 63 | Ht 64.0 in | Wt 173.4 lb

## 2022-09-15 DIAGNOSIS — J45909 Unspecified asthma, uncomplicated: Secondary | ICD-10-CM | POA: Insufficient documentation

## 2022-09-15 DIAGNOSIS — I119 Hypertensive heart disease without heart failure: Secondary | ICD-10-CM | POA: Diagnosis not present

## 2022-09-15 DIAGNOSIS — I4819 Other persistent atrial fibrillation: Secondary | ICD-10-CM | POA: Diagnosis not present

## 2022-09-15 DIAGNOSIS — I2510 Atherosclerotic heart disease of native coronary artery without angina pectoris: Secondary | ICD-10-CM | POA: Diagnosis not present

## 2022-09-15 DIAGNOSIS — D6869 Other thrombophilia: Secondary | ICD-10-CM

## 2022-09-15 DIAGNOSIS — G4733 Obstructive sleep apnea (adult) (pediatric): Secondary | ICD-10-CM | POA: Insufficient documentation

## 2022-09-15 DIAGNOSIS — Z7901 Long term (current) use of anticoagulants: Secondary | ICD-10-CM | POA: Insufficient documentation

## 2022-09-15 DIAGNOSIS — Z87891 Personal history of nicotine dependence: Secondary | ICD-10-CM | POA: Insufficient documentation

## 2022-09-15 NOTE — Progress Notes (Signed)
Primary Care Physician: Camie Patience, FNP Primary Cardiologist: Dr Mayford Knife  Primary Electrophysiologist: none Referring Physician: [facility]   [name] is a 67 y.o. female with a history of HTN, OSA, CAD, asthma, atrial fibrillation who presents for follow up in the [facility].  The patient was initially diagnosed with atrial fibrillation 02/13/22 after presenting to the ED with symptoms of palpitations. Her smart watch showed elevated heart rates in the 120s at rest. ECG showed afib with RVR and she was started on metoprolol for rate control and Eliquis for a CHADS2VASC score of 3. Of note, she was diagnosed with COVID the month prior to the onset of her afib.  Patient is s/p DCCV on 03/10/22.   On follow up today, patient reports that she has done well since her last visit. She has not had any issues with afib in the interim. She does still have symptoms of fatigue, especially with exertion, despite decreasing BB and changing it to bedtime dosing. No bleeding issues on anticoagulation.    Today, she denies symptoms of palpitations, chest pain, shortness of breath, orthopnea, PND, lower extremity edema, dizziness, presyncope, syncope, snoring, daytime somnolence, bleeding, or neurologic sequela. The patient is tolerating medications without difficulties and is otherwise without complaint today.    Atrial Fibrillation Risk Factors:  she does not have symptoms or diagnosis of sleep apnea. she does not have a history of rheumatic fever. she does have a history of alcohol use. The patient does not have a history of early familial atrial fibrillation or other arrhythmias.  she has a BMI of Body mass index is 29.76 kg/m.Marland Kitchen Filed Weights   09/15/22 1525  Weight: 78.7 kg   No family history on file.   Atrial Fibrillation Management history:  Previous antiarrhythmic drugs: none Previous cardioversions: 03/10/22 Previous ablations:  none Anticoagulation history: Eliquis   Past Medical History:  Diagnosis Date   Agatston coronary artery calcium score less than 100    coronary Ca score 29   Anxiety    Aortic insufficiency    mild to moderate by echo 02/2022   Asthma    Hyperlipidemia LDL goal <70    Hypertension    PAF (paroxysmal atrial fibrillation) (HCC)    Past Surgical History:  Procedure Laterality Date   CARDIOVERSION N/A 03/10/2022   Procedure: CARDIOVERSION;  Surgeon: Quintella Reichert, MD;  Location: [facility];  Service: Cardiovascular;  Laterality: N/A;    Current Outpatient Medications  Medication Sig Dispense Refill   albuterol (VENTOLIN HFA) 108 (90 Base) MCG/ACT inhaler Inhale 2 puffs into the lungs every 4 (four) hours as needed for shortness of breath.     ALPRAZolam (XANAX) 0.25 MG tablet Take 0.25 mg by mouth daily as needed for anxiety.     amLODipine (NORVASC) 10 MG tablet Take 10 mg by mouth daily.     Carboxymethylcellul-Glycerin (LUBRICATING EYE DROPS OP) Place 1 drop into both eyes daily as needed (dry eyes).     ELIQUIS 5 MG TABS tablet TAKE 1 TABLET BY MOUTH TWICE A DAY 60 tablet 4   escitalopram (LEXAPRO) 20 MG tablet Take 20 mg by mouth daily.     levothyroxine (SYNTHROID) 75 MCG tablet Take 1 tablet by mouth daily.     losartan (COZAAR) 100 MG tablet Take 1 tablet by mouth daily.     metoprolol succinate (TOPROL XL) 25 MG 24 hr tablet Take 1 tablet (25 mg total) by mouth at bedtime. 90  tablet 3   montelukast (SINGULAIR) 10 MG tablet Take 10 mg by mouth daily.     Multiple Vitamin (MULTIVITAMIN WITH MINERALS) TABS tablet Take 1 tablet by mouth 4 (four) times a week.     Omega-3 Fatty Acids (FISH OIL OMEGA-3 PO) Take 2 capsules by mouth 4 (four) times a week.     omeprazole (PRILOSEC OTC) 20 MG tablet Take 20 mg by mouth daily as needed (acid reflux).     rosuvastatin (CRESTOR) 10 MG tablet 1 tablet Orally Once a day for 90 days     Probiotic Product (PROBIOTIC PO) Take 1 tablet  by mouth every morning. (Patient not taking: Reported on 09/15/2022)     No current facility-administered medications for this encounter.    Allergies  Allergen Reactions   Amoxicillin Other (See Comments)    Nervous   Diclofenac Hives    rapid heart beat     Social History   Socioeconomic History   Marital status: Divorced    Spouse name: Not on file   Number of children: Not on file   Years of education: Not on file   Highest education level: Not on file  Occupational History   Not on file  Tobacco Use   Smoking status: Former    Types: Cigarettes   Smokeless tobacco: Never   Tobacco comments:    Former smoker 03/17/22  Substance and Sexual Activity   Alcohol use: Yes    Alcohol/week: 12.0 standard drinks of alcohol    Types: 12 Standard drinks or equivalent per week    Comment: 1-2 glasses of wine nightly 03/17/22   Drug use: Never   Sexual activity: Not on file  Other Topics Concern   Not on file  Social History Narrative   Not on file   Social Determinants of Health   Financial Resource Strain: Not on file  Food Insecurity: Not on file  Transportation Needs: Not on file  Physical Activity: Not on file  Stress: Not on file  Social Connections: Not on file  Intimate Partner Violence: Not on file     ROS- All systems are reviewed and negative except as per the HPI above.  Physical Exam: Vitals:   09/15/22 1525  BP: (!) 140/76  Pulse: 63  Weight: 78.7 kg  Height: 5\' 4"  (1.626 m)    GEN- The patient is a well appearing female, alert and oriented x 3 today.   HEENT-head normocephalic, atraumatic, sclera clear, conjunctiva pink, hearing intact, trachea midline. Lungs- Clear to ausculation bilaterally, normal work of breathing Heart- Regular rate and rhythm, no murmurs, rubs or gallops  GI- soft, NT, ND, + BS Extremities- no clubbing, cyanosis, or edema MS- no significant deformity or atrophy Skin- no rash or lesion Psych- euthymic mood, full  affect Neuro- strength and sensation are intact   Wt Readings from Last 3 Encounters:  09/15/22 78.7 kg  05/18/22 75.8 kg  03/17/22 75.4 kg    EKG today demonstrates  SR, LAFB, 1st degree AV block Vent. rate 63 BPM PR interval 204 ms QRS duration 88 ms QT/QTcB 436/446 ms   Echo 02/25/22 1. Left ventricular ejection fraction by 3D volume is 59 %. The left  ventricle has normal function. The left ventricle has no regional wall  motion abnormalities. Left ventricular diastolic parameters are  indeterminate.   2. Right ventricular systolic function is normal. The right ventricular  size is normal. Tricuspid regurgitation signal is inadequate for assessing  PA pressure.  3. Left atrial size was mildly dilated.   4. Right atrial size was mildly dilated.   5. The mitral valve is normal in structure. Mild mitral valve  regurgitation. No evidence of mitral stenosis.   6. The aortic valve is normal in structure. Aortic valve regurgitation is  mild to moderate. No aortic stenosis is present.   7. There is borderline dilatation of the ascending aorta, measuring 37  mm.   8. The inferior vena cava is dilated in size with >50% respiratory  variability, suggesting right atrial pressure of 8 mmHg.    Epic records are reviewed at length today  CHA2DS2-VASc Score = 3  The patient's score is based upon: CHF History: 0 HTN History: 1 Diabetes History: 0 Stroke History: 0 Vascular Disease History: 0 Age Score: 1 Gender Score: 1       ASSESSMENT AND PLAN: 1. Persistent Atrial Fibrillation (ICD10:  I48.19) The patient's CHA2DS2-VASc score is 3, indicating a 3.2% annual risk of stroke.   S/p DCCV on 03/10/22 Patient appears to be maintaining SR.  Will do a trial of metoprolol to see if her fatigue improves. Will use it only PRN if she has heart racing. Continue Eliquis 5 mg BID  2. Secondary Hypercoagulable State (ICD10:  D68.69) The patient is at significant risk for  stroke/thromboembolism based upon her CHA2DS2-VASc Score of 3.  Continue Apixaban (Eliquis).   3. HTN Stable, no changes today.  4. Valvular heart disease Mild MR Mild-moderate AR  5. CAD CAC score 29.4 in LAD On statin No anginal symptoms.  6. OSA Moderate on study 05/25/22 Patient questioning diagnosis since the study was only 2.5 hours. Encouraged her to reach out to Dr Norris Cross office for clarification.    Follow up with Dr Mayford Knife per recall. AF clinic in one year.    Jorja Loa PA-C [facility] [address] [place], [place] [address] [phone] 09/15/2022 3:42 PM

## 2022-09-16 DIAGNOSIS — H0259 Other disorders affecting eyelid function: Secondary | ICD-10-CM | POA: Diagnosis not present

## 2022-09-16 DIAGNOSIS — I480 Paroxysmal atrial fibrillation: Secondary | ICD-10-CM | POA: Diagnosis not present

## 2022-09-16 DIAGNOSIS — H1033 Unspecified acute conjunctivitis, bilateral: Secondary | ICD-10-CM | POA: Diagnosis not present

## 2022-11-05 DIAGNOSIS — H524 Presbyopia: Secondary | ICD-10-CM | POA: Diagnosis not present

## 2022-11-05 DIAGNOSIS — H2513 Age-related nuclear cataract, bilateral: Secondary | ICD-10-CM | POA: Diagnosis not present

## 2022-11-30 IMAGING — MG MM DIGITAL SCREENING BILAT W/ TOMO AND CAD
8 series · 8 of 24 positions shown · non-contrast
Comparison: Previous exam(s).

CLINICAL DATA: Screening.

EXAM:
DIGITAL SCREENING BILATERAL MAMMOGRAM WITH TOMOSYNTHESIS AND CAD
TECHNIQUE: Bilateral screening digital craniocaudal and mediolateral oblique
mammograms were obtained. Bilateral screening digital breast
tomosynthesis was performed. The images were evaluated with
computer-aided detection.

[L CC synth-2D]
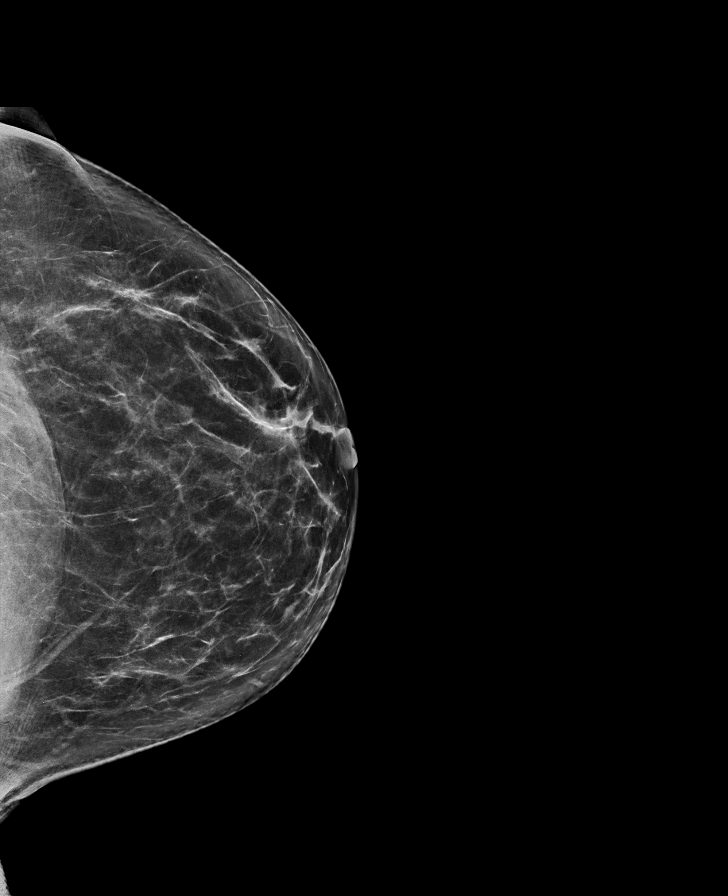

[R CC synth-2D]
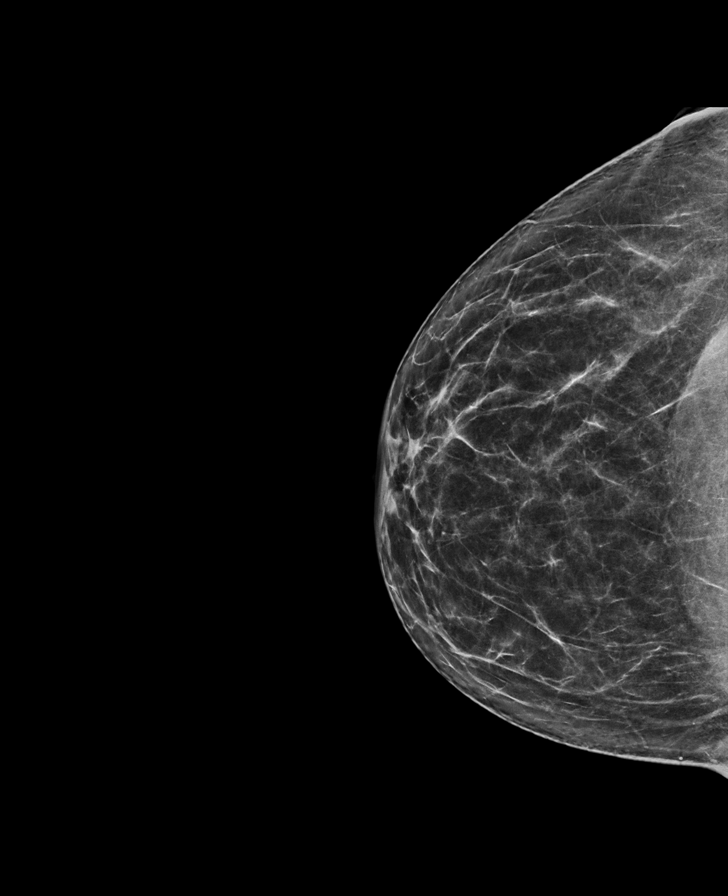

[R MLO synth-2D]
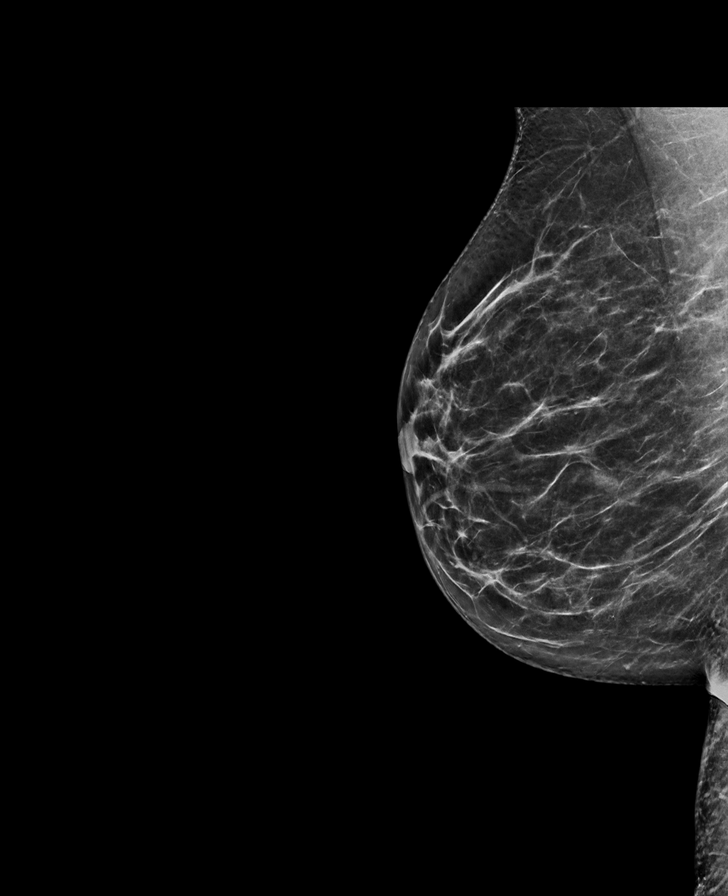

[L MLO synth-2D]
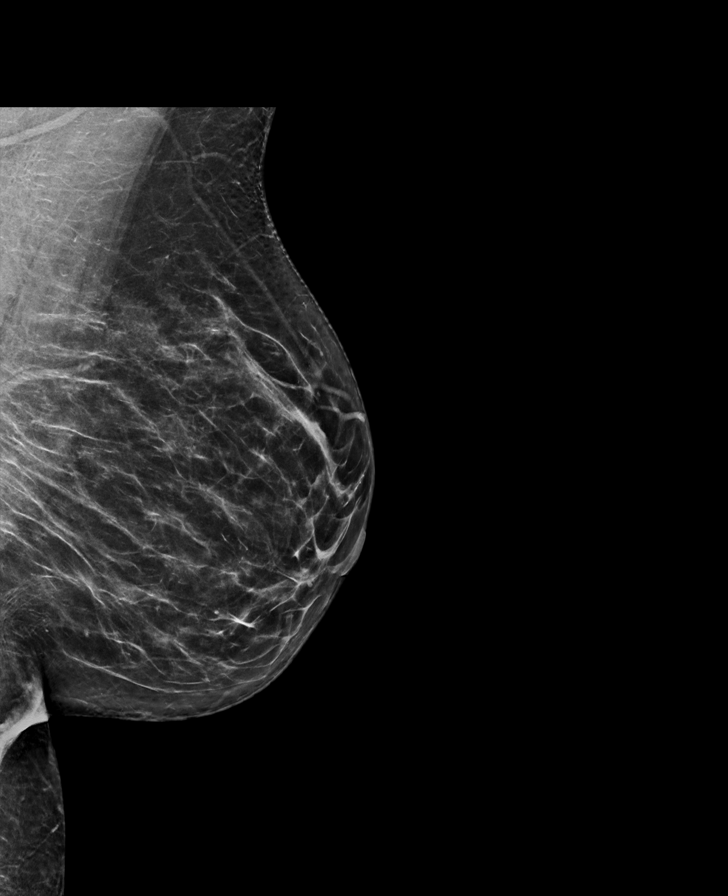

[L MLO tomo · tomo slice 37/73.0]
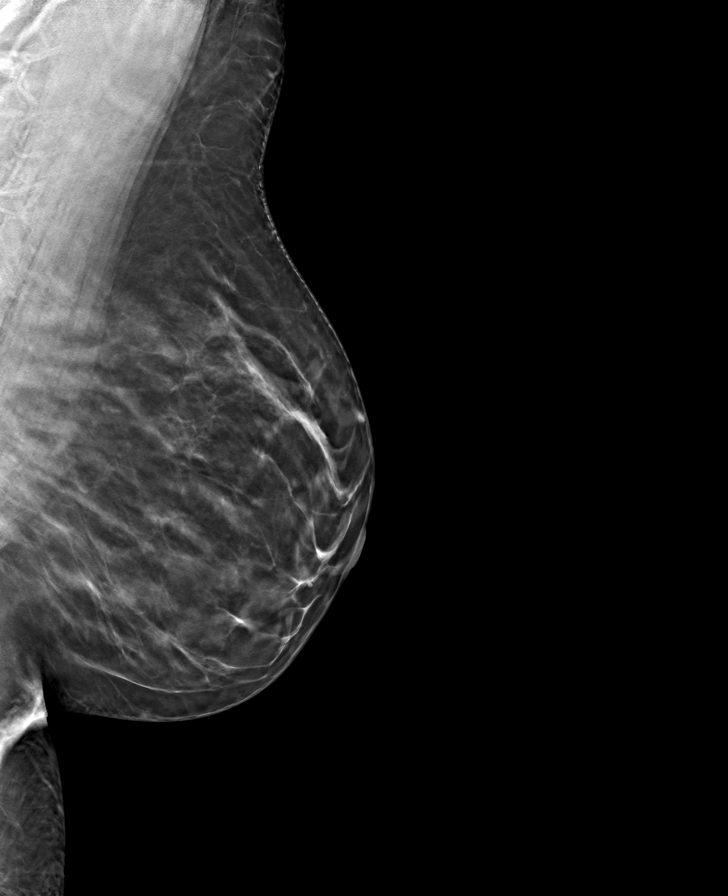

[R CC tomo · tomo slice 35/70.0]
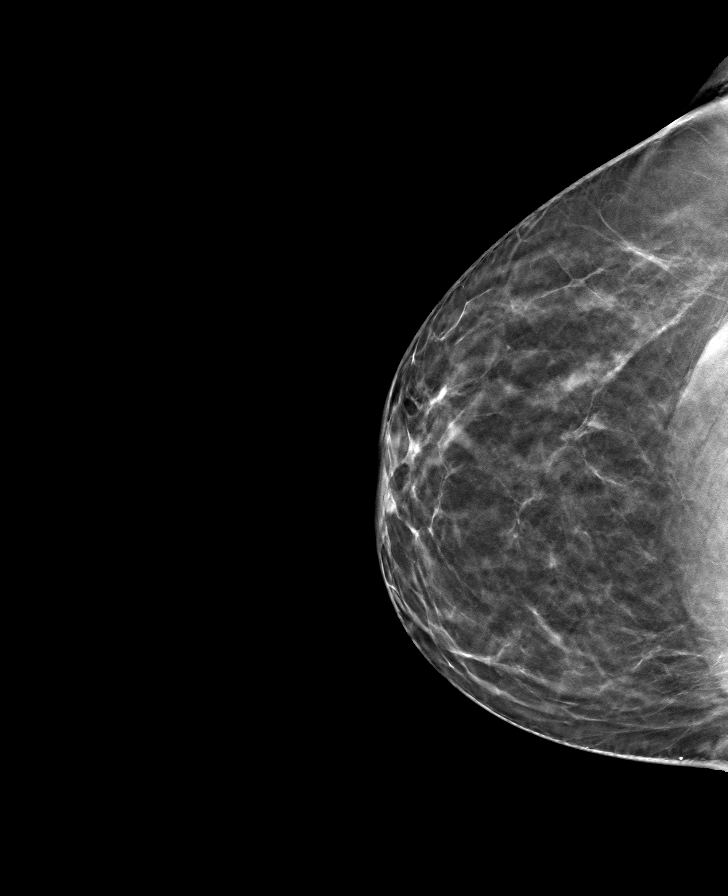

[L CC tomo · tomo slice 37/73.0]
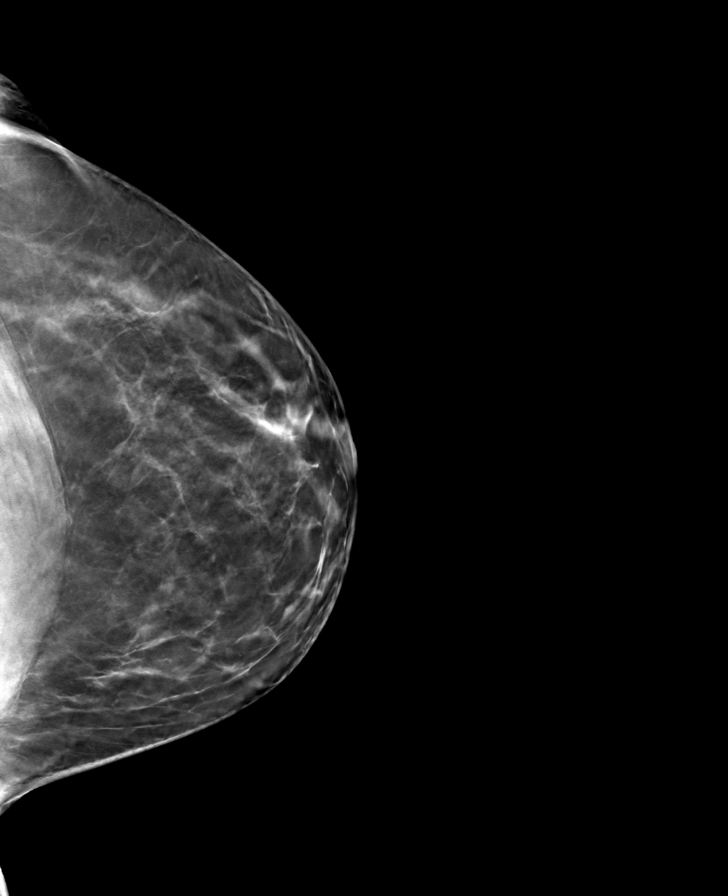

[R MLO tomo · tomo slice 34/67.0]
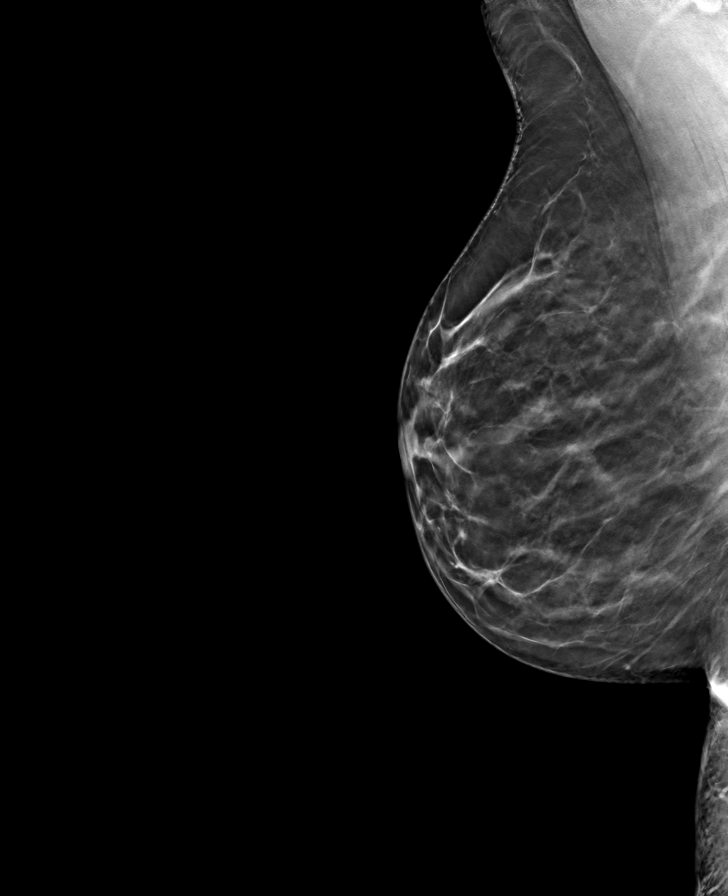

[8 of 24 positions shown; findings below may reference images not displayed]

ACR Breast Density Category b: There are scattered areas of
fibroglandular density.
FINDINGS: There are no findings suspicious for malignancy.
IMPRESSION: No mammographic evidence of malignancy. A result letter of this
screening mammogram will be mailed directly to the patient.

RECOMMENDATION:
Screening mammogram in one year. (Code:51-O-LD2)

BI-RADS CATEGORY  1: Negative.

## 2023-01-16 ENCOUNTER — Other Ambulatory Visit (HOSPITAL_COMMUNITY): Payer: Self-pay | Admitting: Physician Assistant

## 2023-02-01 DIAGNOSIS — I480 Paroxysmal atrial fibrillation: Secondary | ICD-10-CM | POA: Diagnosis not present

## 2023-02-01 DIAGNOSIS — E039 Hypothyroidism, unspecified: Secondary | ICD-10-CM | POA: Diagnosis not present

## 2023-02-01 DIAGNOSIS — E785 Hyperlipidemia, unspecified: Secondary | ICD-10-CM | POA: Diagnosis not present

## 2023-02-01 DIAGNOSIS — F419 Anxiety disorder, unspecified: Secondary | ICD-10-CM | POA: Diagnosis not present

## 2023-02-01 DIAGNOSIS — E559 Vitamin D deficiency, unspecified: Secondary | ICD-10-CM | POA: Diagnosis not present

## 2023-02-01 DIAGNOSIS — D6869 Other thrombophilia: Secondary | ICD-10-CM | POA: Diagnosis not present

## 2023-02-01 DIAGNOSIS — Z23 Encounter for immunization: Secondary | ICD-10-CM | POA: Diagnosis not present

## 2023-02-01 DIAGNOSIS — I10 Essential (primary) hypertension: Secondary | ICD-10-CM | POA: Diagnosis not present

## 2023-02-01 DIAGNOSIS — Z0000 Encounter for general adult medical examination without abnormal findings: Secondary | ICD-10-CM | POA: Diagnosis not present

## 2023-02-16 ENCOUNTER — Other Ambulatory Visit: Payer: Self-pay | Admitting: Family Medicine

## 2023-02-16 DIAGNOSIS — Z1231 Encounter for screening mammogram for malignant neoplasm of breast: Secondary | ICD-10-CM

## 2023-03-01 ENCOUNTER — Ambulatory Visit (HOSPITAL_COMMUNITY): Payer: Medicare PPO | Attending: Cardiovascular Disease

## 2023-03-01 DIAGNOSIS — I351 Nonrheumatic aortic (valve) insufficiency: Secondary | ICD-10-CM | POA: Diagnosis not present

## 2023-03-01 LAB — ECHOCARDIOGRAM COMPLETE
Area-P 1/2: 3 cm2
P 1/2 time: 595 ms
S' Lateral: 2.9 cm

## 2023-03-02 ENCOUNTER — Encounter: Payer: Self-pay | Admitting: Cardiology

## 2023-03-05 ENCOUNTER — Telehealth: Payer: Self-pay

## 2023-03-05 DIAGNOSIS — I351 Nonrheumatic aortic (valve) insufficiency: Secondary | ICD-10-CM

## 2023-03-05 NOTE — Telephone Encounter (Signed)
Call to patient to advise echo showed normal pumping function of the heart with mildly thickened heart muscle and stiffness of the heart called diastolic dysfunction which is normal for age.  Patient verbalizes left atrium is enlarged likely due to hypertension and atrial fibrillation and there is moderate leakiness of the aortic valve.  Patient agrees to repeat echo in 1 year for aortic valve insufficiency.

## 2023-03-05 NOTE — Telephone Encounter (Signed)
-----   Message from [name] sent at 03/02/2023  5:24 PM EST ----- Echo showed normal pumping function of the heart with mildly thickened heart muscle and Cree stiffness of the heart called diastolic dysfunction normal for age.  The left atrium is enlarged likely due to hypertension and atrial fibrillation.  There is moderate leakiness of the aortic valve.  Recommend repeat echo in 1 year for aortic valve insufficiency

## 2023-03-08 DIAGNOSIS — D709 Neutropenia, unspecified: Secondary | ICD-10-CM | POA: Diagnosis not present

## 2023-03-20 ENCOUNTER — Other Ambulatory Visit: Payer: Self-pay | Admitting: Cardiology

## 2023-03-20 DIAGNOSIS — I351 Nonrheumatic aortic (valve) insufficiency: Secondary | ICD-10-CM

## 2023-03-26 ENCOUNTER — Ambulatory Visit
Admission: RE | Admit: 2023-03-26 | Discharge: 2023-03-26 | Disposition: A | Discharge status: Home / Self Care | Payer: Medicare PPO | Source: Ambulatory Visit | Attending: Family Medicine | Admitting: Family Medicine

## 2023-03-26 DIAGNOSIS — Z1231 Encounter for screening mammogram for malignant neoplasm of breast: Secondary | ICD-10-CM

## 2023-04-24 ENCOUNTER — Other Ambulatory Visit: Payer: Self-pay | Admitting: Medical Genetics

## 2023-04-26 DIAGNOSIS — I480 Paroxysmal atrial fibrillation: Secondary | ICD-10-CM | POA: Diagnosis not present

## 2023-05-17 ENCOUNTER — Other Ambulatory Visit (HOSPITAL_COMMUNITY)
Admission: RE | Admit: 2023-05-17 | Discharge: 2023-05-17 | Disposition: A | Discharge status: Home / Self Care | Payer: Self-pay | Source: Ambulatory Visit | Attending: Medical Genetics | Admitting: Medical Genetics

## 2023-05-24 ENCOUNTER — Encounter: Payer: Self-pay | Admitting: *Deleted

## 2023-05-25 ENCOUNTER — Encounter: Payer: Self-pay | Admitting: Cardiology

## 2023-05-25 ENCOUNTER — Ambulatory Visit: Payer: Medicare PPO | Attending: Cardiology | Admitting: Cardiology

## 2023-05-25 VITALS — BP 138/90 | HR 76 | Ht 64.0 in | Wt 175.2 lb

## 2023-05-25 DIAGNOSIS — Z79899 Other long term (current) drug therapy: Secondary | ICD-10-CM

## 2023-05-25 DIAGNOSIS — I480 Paroxysmal atrial fibrillation: Secondary | ICD-10-CM | POA: Diagnosis not present

## 2023-05-25 DIAGNOSIS — G4733 Obstructive sleep apnea (adult) (pediatric): Secondary | ICD-10-CM | POA: Diagnosis not present

## 2023-05-25 DIAGNOSIS — I351 Nonrheumatic aortic (valve) insufficiency: Secondary | ICD-10-CM

## 2023-05-25 DIAGNOSIS — I10 Essential (primary) hypertension: Secondary | ICD-10-CM | POA: Diagnosis not present

## 2023-05-25 DIAGNOSIS — R931 Abnormal findings on diagnostic imaging of heart and coronary circulation: Secondary | ICD-10-CM

## 2023-05-25 DIAGNOSIS — E785 Hyperlipidemia, unspecified: Secondary | ICD-10-CM | POA: Diagnosis not present

## 2023-05-25 NOTE — Progress Notes (Signed)
 Cardiology Note    Date:  05/25/2023   ID:  [name], DOB [birth year], MRN [id number]  PCP:  Dyane Anthony RAMAN, FNP  Cardiologist:  Wilbert Bihari, MD   Chief Complaint  Patient presents with   Coronary Artery Disease   Hypertension   Aortic Insuffiency   Hyperlipidemia   Atrial Fibrillation   Sleep Apnea    History of Present Illness:  [name] is a 68 y.o. female with a history of anxiety, asthma and hypertension.  She was initially seen in the ER 02/13/2022 with palpitations and was diagnosed with atrial fibrillation.  She was started on metoprolol  for rate control and Eliquis  for CHADS2 Vascor 3.  She was diagnosed with COVID a month prior to that.    She was then referred to our A-fib clinic.  2D echo 02/25/2022 showed normal LV function with EF 59% with mild biatrial enlargement, mild MR and mild to moderate AR.  On 03/10/2022 she underwent cardioversion to sinus rhythm.    Due to her PAF a HST was ordered which demonstrated moderate obstructive sleep apnea with an AHI of 25.9/h and nocturnal hypoxemia.  She was started on auto CPAP from 4 to 15 cm H2O.  She is here today for followup and is doing well.  She denies any chest pain or pressure, SOB, DOE, PND, orthopnea, LE edema, dizziness, palpitations or syncope. She is compliant with her meds and is tolerating meds with no SE.    She is doing well with her PAP device and thinks that she has gotten used to it.  She tolerates the mask and feels the pressure is adequate.  Since going on PAP she feels rested in the am and has no significant daytime sleepiness.  She denies any significant mouth or nasal dryness or nasal congestion.  She does not think that he snores.    Past Medical History:  Diagnosis Date   Agatston coronary artery calcium score less than 100    coronary Ca score 29   Anxiety    Aortic insufficiency    Moderate by echo 02/2023   Asthma    Hyperlipidemia LDL goal <70    Hypertension    PAF  (paroxysmal atrial fibrillation) (HCC)     Past Surgical History:  Procedure Laterality Date   CARDIOVERSION N/A 03/10/2022   Procedure: CARDIOVERSION;  Surgeon: Bihari Wilbert SAUNDERS, MD;  Location: [facility];  Service: Cardiovascular;  Laterality: N/A;    Current Medications: Current Meds  Medication Sig   albuterol (VENTOLIN HFA) 108 (90 Base) MCG/ACT inhaler Inhale 2 puffs into the lungs every 4 (four) hours as needed for shortness of breath.   ALPRAZolam (XANAX) 0.25 MG tablet Take 0.25 mg by mouth daily as needed for anxiety.   amLODipine (NORVASC) 10 MG tablet Take 10 mg by mouth daily.   apixaban  (ELIQUIS ) 5 MG TABS tablet TAKE 1 TABLET BY MOUTH TWICE A DAY   escitalopram (LEXAPRO) 20 MG tablet Take 20 mg by mouth daily.   levothyroxine (SYNTHROID) 75 MCG tablet Take 1 tablet by mouth daily.   losartan (COZAAR) 100 MG tablet Take 1 tablet by mouth daily.   montelukast (SINGULAIR) 10 MG tablet Take 10 mg by mouth daily.   Multiple Vitamin (MULTIVITAMIN WITH MINERALS) TABS tablet Take 1 tablet by mouth 4 (four) times a week.   omeprazole (PRILOSEC OTC) 20 MG tablet Take 20 mg by mouth daily as needed (acid reflux).   rosuvastatin (CRESTOR) 10 MG tablet 1  tablet Orally Once a day for 90 days    Allergies:   Amoxicillin and Diclofenac   Social History   Socioeconomic History   Marital status: Married    Spouse name: Not on file   Number of children: Not on file   Years of education: Not on file   Highest education level: Not on file  Occupational History   Not on file  Tobacco Use   Smoking status: Former    Types: Cigarettes   Smokeless tobacco: Never   Tobacco comments:    Former smoker 03/17/22  Substance and Sexual Activity   Alcohol use: Yes    Alcohol/week: 12.0 standard drinks of alcohol    Types: 12 Standard drinks or equivalent per week    Comment: 1-2 glasses of wine nightly 03/17/22   Drug use: Never   Sexual activity: Not on file  Other Topics Concern    Not on file  Social History Narrative   Not on file   Social Drivers of Health   Financial Resource Strain: Not on file  Food Insecurity: Not on file  Transportation Needs: Not on file  Physical Activity: Not on file  Stress: Not on file  Social Connections: Not on file     Family History:  The patient's family history is not on file.   ROS:   Please see the history of present illness.    ROS All other systems reviewed and are negative.      No data to display             PHYSICAL EXAM:   VS:  BP (!) 138/90   Pulse 76   Ht 5' 4 (1.626 m)   Wt 175 lb 3.2 oz (79.5 kg)   LMP 09/21/2010   SpO2 97%   BMI 30.07 kg/m    GEN: Well nourished, well developed in no acute distress HEENT: Normal NECK: No JVD; No carotid bruits LYMPHATICS: No lymphadenopathy CARDIAC:RRR, no murmurs, rubs, gallops RESPIRATORY:  Clear to auscultation without rales, wheezing or rhonchi  ABDOMEN: Soft, non-tender, non-distended MUSCULOSKELETAL:  No edema; No deformity  SKIN: Warm and dry NEUROLOGIC:  Alert and oriented x 3 PSYCHIATRIC:  Normal affect  Wt Readings from Last 3 Encounters:  05/25/23 175 lb 3.2 oz (79.5 kg)  09/15/22 173 lb 6.4 oz (78.7 kg)  05/18/22 167 lb (75.8 kg)      Studies/Labs Reviewed:    Recent Labs: No results found for requested labs within last 365 days.   Lipid Panel No results found for: CHOL, TRIG, HDL, CHOLHDL, VLDL, LDLCALC, LDLDIRECT  Additional studies/ records that were reviewed today include:  Office visit notes from     ASSESSMENT:    1. PAF (paroxysmal atrial fibrillation) (HCC)   2. Primary hypertension   3. Nonrheumatic aortic valve insufficiency   4. Agatston coronary artery calcium score less than 100   5. Hyperlipidemia LDL goal <70   6. OSA (obstructive sleep apnea)       PLAN:  In order of problems listed above:  Paroxysmal atrial fibrillation -Status post DCCV 02/25/2022 -She is followed in A-fib clinic  and has been maintaining sinus rhythm -She denies any palpitations and no bleeding problems on apixaban  -Continue prescription drug management with apixaban  5 mg twice daily with as needed refills -She had been on Toprol  but this was stopped due to fatigue -Continue CPAP therapy -I will get a copy of her creatinine that was recently done in January from her PCP. -  I have personally reviewed and interpreted outside labs performed by patient's PCP which showed hemoglobin 12.5 on 04/26/2023  2.  Hypertension -BP is rolled on exam today -Continue prescription drug managed with amlodipine 10 mg daily, losartan 100 mg daily with as needed refills  3.  Aortic insufficiency -Mild to moderate on 2D echo 02/25/2022 -2D echo 03/10/2023 with moderate AI -Repeat 2D echo 02/2024  4.  Coronary artery calcifications -coronary Ca score is 29.4 in the LAD -She has not had any anginal symptoms since I saw her last -no ASA due to DOAC -Continue drug management with Crestor 10 mg daily with as needed refills  5.  HLD -LDL goal < 70 -I have personally reviewed and interpreted outside labs performed by patient's PCP which showed LDL 107 HDL 80 on 02/01/2023 and ALT 17 on 04/26/2023 -Continue Crestor 10 mg daily -Will repeat FLP and CMET since she said she ate poorly over the past 2 months and thinks that she might be able to bring it down with exercise which she has been doing and is also improved her diet since the holidays  6. OSA  -Patient was found to have moderate obstructive sleep apnea with an AHI of 25.9/h and nocturnal hypoxemia. -CPAP therapy was recommended but patient felt that since she only slept for 2 hours and 37 minutes it was not an accurate test -I explained to her that based on the data this was an accurate test and despite her only sleeping 2-1/2 hours during that period of time she stopped breathing 26 times per hour and therefore CPAP was recommended especially with a history of atrial  fibrillation -We discussed the potential complications of untreated sleep apnea including ongoing cardiac arrhythmias, development of congestive heart failure/pulmonary hypertension, increased stroke risk, diabetes, hypertension. -I recommended that she go on a trial of auto CPAP from 4 to 15 cm H2O with heated humidity and mask of choice  -At this time she would like to see if insurance would pay for a repeat home sleep study to reevaluate the degree of sleep apnea that she has since she says she is completely asymptomatic with no daytime sleepiness or snoring and then make a decision based on that study.  She will let me know her decision going forward  Time Spent: 20 minutes total time of encounter, including 15 minutes spent in face-to-face patient care on the date of this encounter. This time includes coordination of care and counseling regarding above mentioned problem list. Remainder of non-face-to-face time involved reviewing chart documents/testing relevant to the patient encounter and documentation in the medical record. I have independently reviewed documentation from referring provider  Medication Adjustments/Labs and Tests Ordered: Current medicines are reviewed at length with the patient today.  Concerns regarding medicines are outlined above.  Medication changes, Labs and Tests ordered today are listed in the Patient Instructions below.  There are no Patient Instructions on file for this visit.   Signed, Wilbert Bihari, MD  05/25/2023 2:56 PM    [facility] [address] [place], [place], [place]  [address] Phone: [phone]; Fax: [phone]

## 2023-05-25 NOTE — Patient Instructions (Signed)
Medication Instructions:  Your physician recommends that you continue on your current medications as directed. Please refer to the Current Medication list given to you today.  *If you need a refill on your cardiac medications before your next appointment, please call your pharmacy*   Lab Work: Please complete your FASTING lipid panel and ALT by 06/01/23 at an [facility].   If you have labs (blood work) drawn today and your tests are completely normal, you will receive your results only by: MyChart Message (if you have MyChart) OR A paper copy in the mail If you have any lab test that is abnormal or we need to change your treatment, we will call you to review the results.   Testing/Procedures: Your physician has requested that you have an echocardiogram in November 2025. Echocardiography is a painless test that uses sound waves to create images of your heart. It provides your doctor with information about the size and shape of your heart and how well your heart's chambers and valves are working. This procedure takes approximately one hour. There are no restrictions for this procedure. Please do NOT wear cologne, perfume, aftershave, or lotions (deodorant is allowed). Please arrive 15 minutes prior to your appointment time.  Please note: We ask at that you not bring children with you during ultrasound (echo/ vascular) testing. Due to room size and safety concerns, children are not allowed in the ultrasound rooms during exams. Our front office staff cannot provide observation of children in our lobby area while testing is being conducted. An adult accompanying a patient to their appointment will only be allowed in the ultrasound room at the discretion of the ultrasound technician under special circumstances. We apologize for any inconvenience.\    Follow-Up: At [facility], you and your health needs are our priority.  As part of our continuing mission to provide you with exceptional heart  care, we have created designated Provider Care Teams.  These Care Teams include your primary Cardiologist (physician) and Advanced Practice Providers (APPs -  Physician Assistants and Nurse Practitioners) who all work together to provide you with the care you need, when you need it.  We recommend signing up for the patient portal called "MyChart".  Sign up information is provided on this After Visit Summary.  MyChart is used to connect with patients for Virtual Visits (Telemedicine).  Patients are able to view lab/test results, encounter notes, upcoming appointments, etc.  Non-urgent messages can be sent to your provider as well.   To learn more about what you can do with MyChart, go to [email or web address].    Your next appointment:   1 year(s)  Provider:   Dr. [name], MD   Other Instructions: Someone will contact you regarding whether or not insurance will allow you to repeat a home sleep study.

## 2023-05-28 DIAGNOSIS — E785 Hyperlipidemia, unspecified: Secondary | ICD-10-CM | POA: Diagnosis not present

## 2023-05-28 DIAGNOSIS — Z79899 Other long term (current) drug therapy: Secondary | ICD-10-CM | POA: Diagnosis not present

## 2023-05-28 LAB — LIPID PANEL
Chol/HDL Ratio: 2.7 {ratio} (ref 0.0–4.4)
Cholesterol, Total: 207 mg/dL — ABNORMAL HIGH (ref 100–199)
HDL: 77 mg/dL (ref >39)
LDL Chol Calc (NIH): 109 mg/dL — ABNORMAL HIGH (ref 0–99)
Triglycerides: 124 mg/dL (ref 0–149)
VLDL Cholesterol Cal: 21 mg/dL (ref 5–40)

## 2023-05-28 LAB — ALT: ALT: 18 [IU]/L (ref 0–32)

## 2023-05-30 LAB — GENECONNECT MOLECULAR SCREEN: Genetic Analysis Overall Interpretation: NEGATIVE

## 2023-06-04 ENCOUNTER — Telehealth: Payer: Self-pay

## 2023-06-04 DIAGNOSIS — G4733 Obstructive sleep apnea (adult) (pediatric): Secondary | ICD-10-CM

## 2023-06-04 NOTE — Telephone Encounter (Signed)
**Note De-Identified Jaaziah Schulke Obfuscation** I did a Itamar-HST PA through [employer] provider portal and received this message: Doesn't require submission. [id number] Sleep study, unattended, simultaneous recording; heart rate, oxygen saturation, respiratory analysis (eg, by airflow or peripheral arterial tone), and sleep time.  Forwarding this message to Dr Norris Cross nurse so she can order the pts Itamar-HST, advise the pt of approval through her ins plan and to provide her with a WatchPAT One-HST device with the Pin #.

## 2023-06-04 NOTE — Telephone Encounter (Signed)
Left message to call back to set up a time to pick up a repeat Itamar device, see notes.

## 2023-06-04 NOTE — Telephone Encounter (Signed)
Patient requesting repeat home sleep study. She states she was only able to sleep 2.5 hours. Dr. Mayford Knife feels what data was captured does show sleep apnea, but patient states she wants to be sure before she commits to trying cpap.   Per Larita Fife Via in sleep studies pool, patient insurance does not require prior auth. New order placed.

## 2023-06-04 NOTE — Telephone Encounter (Signed)
**Note De-identified Sela Falk Obfuscation** -----  **Note De-Identified Evelia Waskey Obfuscation** Message from Nurse Alcario Drought E sent at 05/25/2023  3:17 PM EST ----- Regarding: Insurance auth for repeat Itamar home sleep study Per Dr. Mayford Knife, patient had an Itamar home sleep study but they were only able to get 2.5 hours of data. She would like to repeat the study to see if she really does have sleep apnea, although Dr. Mayford Knife feels the sleep study was adequate and did show she has sleep apnea. She is refusing to proceed with cpap until she gets an answer from insurance about whether she can repeat Itamar home sleep study. Could someone check on this and give her a call? Alcario Drought, RN

## 2023-06-07 ENCOUNTER — Telehealth: Payer: Self-pay

## 2023-06-07 ENCOUNTER — Other Ambulatory Visit: Payer: Self-pay

## 2023-06-07 DIAGNOSIS — Z79899 Other long term (current) drug therapy: Secondary | ICD-10-CM

## 2023-06-07 DIAGNOSIS — E785 Hyperlipidemia, unspecified: Secondary | ICD-10-CM

## 2023-06-07 NOTE — Telephone Encounter (Signed)
 Patient advised via Mychart that Dr. Mayford Knife is ok with her making diet and exercise changes for 3 months and then repeat FLP along with a Lp(a). Labs ordered and released.

## 2023-08-04 DIAGNOSIS — M542 Cervicalgia: Secondary | ICD-10-CM | POA: Diagnosis not present

## 2023-08-04 DIAGNOSIS — M9902 Segmental and somatic dysfunction of thoracic region: Secondary | ICD-10-CM | POA: Diagnosis not present

## 2023-08-04 DIAGNOSIS — M9903 Segmental and somatic dysfunction of lumbar region: Secondary | ICD-10-CM | POA: Diagnosis not present

## 2023-08-04 DIAGNOSIS — M5431 Sciatica, right side: Secondary | ICD-10-CM | POA: Diagnosis not present

## 2023-08-04 DIAGNOSIS — M9901 Segmental and somatic dysfunction of cervical region: Secondary | ICD-10-CM | POA: Diagnosis not present

## 2023-08-09 DIAGNOSIS — M9902 Segmental and somatic dysfunction of thoracic region: Secondary | ICD-10-CM | POA: Diagnosis not present

## 2023-08-09 DIAGNOSIS — M542 Cervicalgia: Secondary | ICD-10-CM | POA: Diagnosis not present

## 2023-08-09 DIAGNOSIS — M5431 Sciatica, right side: Secondary | ICD-10-CM | POA: Diagnosis not present

## 2023-08-09 DIAGNOSIS — M9903 Segmental and somatic dysfunction of lumbar region: Secondary | ICD-10-CM | POA: Diagnosis not present

## 2023-08-09 DIAGNOSIS — M9901 Segmental and somatic dysfunction of cervical region: Secondary | ICD-10-CM | POA: Diagnosis not present

## 2023-08-17 DIAGNOSIS — M9902 Segmental and somatic dysfunction of thoracic region: Secondary | ICD-10-CM | POA: Diagnosis not present

## 2023-08-17 DIAGNOSIS — M9903 Segmental and somatic dysfunction of lumbar region: Secondary | ICD-10-CM | POA: Diagnosis not present

## 2023-08-17 DIAGNOSIS — M542 Cervicalgia: Secondary | ICD-10-CM | POA: Diagnosis not present

## 2023-08-17 DIAGNOSIS — M9901 Segmental and somatic dysfunction of cervical region: Secondary | ICD-10-CM | POA: Diagnosis not present

## 2023-08-17 DIAGNOSIS — M5431 Sciatica, right side: Secondary | ICD-10-CM | POA: Diagnosis not present

## 2023-08-24 DIAGNOSIS — Z86018 Personal history of other benign neoplasm: Secondary | ICD-10-CM | POA: Diagnosis not present

## 2023-08-24 DIAGNOSIS — L821 Other seborrheic keratosis: Secondary | ICD-10-CM | POA: Diagnosis not present

## 2023-08-24 DIAGNOSIS — Z85828 Personal history of other malignant neoplasm of skin: Secondary | ICD-10-CM | POA: Diagnosis not present

## 2023-08-24 DIAGNOSIS — R58 Hemorrhage, not elsewhere classified: Secondary | ICD-10-CM | POA: Diagnosis not present

## 2023-08-24 DIAGNOSIS — M9902 Segmental and somatic dysfunction of thoracic region: Secondary | ICD-10-CM | POA: Diagnosis not present

## 2023-08-24 DIAGNOSIS — D225 Melanocytic nevi of trunk: Secondary | ICD-10-CM | POA: Diagnosis not present

## 2023-08-24 DIAGNOSIS — L578 Other skin changes due to chronic exposure to nonionizing radiation: Secondary | ICD-10-CM | POA: Diagnosis not present

## 2023-08-24 DIAGNOSIS — L814 Other melanin hyperpigmentation: Secondary | ICD-10-CM | POA: Diagnosis not present

## 2023-08-24 DIAGNOSIS — M9901 Segmental and somatic dysfunction of cervical region: Secondary | ICD-10-CM | POA: Diagnosis not present

## 2023-08-24 DIAGNOSIS — M542 Cervicalgia: Secondary | ICD-10-CM | POA: Diagnosis not present

## 2023-08-24 DIAGNOSIS — M9903 Segmental and somatic dysfunction of lumbar region: Secondary | ICD-10-CM | POA: Diagnosis not present

## 2023-08-24 DIAGNOSIS — M5431 Sciatica, right side: Secondary | ICD-10-CM | POA: Diagnosis not present

## 2023-08-31 DIAGNOSIS — M5431 Sciatica, right side: Secondary | ICD-10-CM | POA: Diagnosis not present

## 2023-08-31 DIAGNOSIS — M542 Cervicalgia: Secondary | ICD-10-CM | POA: Diagnosis not present

## 2023-08-31 DIAGNOSIS — M9901 Segmental and somatic dysfunction of cervical region: Secondary | ICD-10-CM | POA: Diagnosis not present

## 2023-08-31 DIAGNOSIS — M9903 Segmental and somatic dysfunction of lumbar region: Secondary | ICD-10-CM | POA: Diagnosis not present

## 2023-08-31 DIAGNOSIS — M9902 Segmental and somatic dysfunction of thoracic region: Secondary | ICD-10-CM | POA: Diagnosis not present

## 2023-09-07 DIAGNOSIS — M9902 Segmental and somatic dysfunction of thoracic region: Secondary | ICD-10-CM | POA: Diagnosis not present

## 2023-09-07 DIAGNOSIS — M9903 Segmental and somatic dysfunction of lumbar region: Secondary | ICD-10-CM | POA: Diagnosis not present

## 2023-09-07 DIAGNOSIS — M542 Cervicalgia: Secondary | ICD-10-CM | POA: Diagnosis not present

## 2023-09-07 DIAGNOSIS — M5431 Sciatica, right side: Secondary | ICD-10-CM | POA: Diagnosis not present

## 2023-09-07 DIAGNOSIS — M9901 Segmental and somatic dysfunction of cervical region: Secondary | ICD-10-CM | POA: Diagnosis not present

## 2023-09-21 DIAGNOSIS — M9903 Segmental and somatic dysfunction of lumbar region: Secondary | ICD-10-CM | POA: Diagnosis not present

## 2023-09-21 DIAGNOSIS — M542 Cervicalgia: Secondary | ICD-10-CM | POA: Diagnosis not present

## 2023-09-21 DIAGNOSIS — M9901 Segmental and somatic dysfunction of cervical region: Secondary | ICD-10-CM | POA: Diagnosis not present

## 2023-09-21 DIAGNOSIS — M9902 Segmental and somatic dysfunction of thoracic region: Secondary | ICD-10-CM | POA: Diagnosis not present

## 2023-09-21 DIAGNOSIS — M5431 Sciatica, right side: Secondary | ICD-10-CM | POA: Diagnosis not present

## 2023-09-28 DIAGNOSIS — M5431 Sciatica, right side: Secondary | ICD-10-CM | POA: Diagnosis not present

## 2023-09-28 DIAGNOSIS — M542 Cervicalgia: Secondary | ICD-10-CM | POA: Diagnosis not present

## 2023-09-28 DIAGNOSIS — M9901 Segmental and somatic dysfunction of cervical region: Secondary | ICD-10-CM | POA: Diagnosis not present

## 2023-09-28 DIAGNOSIS — M9903 Segmental and somatic dysfunction of lumbar region: Secondary | ICD-10-CM | POA: Diagnosis not present

## 2023-09-28 DIAGNOSIS — M9902 Segmental and somatic dysfunction of thoracic region: Secondary | ICD-10-CM | POA: Diagnosis not present

## 2023-10-05 DIAGNOSIS — M9902 Segmental and somatic dysfunction of thoracic region: Secondary | ICD-10-CM | POA: Diagnosis not present

## 2023-10-05 DIAGNOSIS — M9903 Segmental and somatic dysfunction of lumbar region: Secondary | ICD-10-CM | POA: Diagnosis not present

## 2023-10-05 DIAGNOSIS — M5431 Sciatica, right side: Secondary | ICD-10-CM | POA: Diagnosis not present

## 2023-10-05 DIAGNOSIS — M9901 Segmental and somatic dysfunction of cervical region: Secondary | ICD-10-CM | POA: Diagnosis not present

## 2023-10-05 DIAGNOSIS — M542 Cervicalgia: Secondary | ICD-10-CM | POA: Diagnosis not present

## 2023-10-12 DIAGNOSIS — M542 Cervicalgia: Secondary | ICD-10-CM | POA: Diagnosis not present

## 2023-10-12 DIAGNOSIS — M9903 Segmental and somatic dysfunction of lumbar region: Secondary | ICD-10-CM | POA: Diagnosis not present

## 2023-10-12 DIAGNOSIS — M9901 Segmental and somatic dysfunction of cervical region: Secondary | ICD-10-CM | POA: Diagnosis not present

## 2023-10-12 DIAGNOSIS — M9902 Segmental and somatic dysfunction of thoracic region: Secondary | ICD-10-CM | POA: Diagnosis not present

## 2023-10-12 DIAGNOSIS — M5431 Sciatica, right side: Secondary | ICD-10-CM | POA: Diagnosis not present

## 2023-10-19 DIAGNOSIS — M9902 Segmental and somatic dysfunction of thoracic region: Secondary | ICD-10-CM | POA: Diagnosis not present

## 2023-10-19 DIAGNOSIS — M9903 Segmental and somatic dysfunction of lumbar region: Secondary | ICD-10-CM | POA: Diagnosis not present

## 2023-10-19 DIAGNOSIS — M542 Cervicalgia: Secondary | ICD-10-CM | POA: Diagnosis not present

## 2023-10-19 DIAGNOSIS — M9901 Segmental and somatic dysfunction of cervical region: Secondary | ICD-10-CM | POA: Diagnosis not present

## 2023-10-19 DIAGNOSIS — M5431 Sciatica, right side: Secondary | ICD-10-CM | POA: Diagnosis not present

## 2023-10-26 DIAGNOSIS — M9903 Segmental and somatic dysfunction of lumbar region: Secondary | ICD-10-CM | POA: Diagnosis not present

## 2023-10-26 DIAGNOSIS — M542 Cervicalgia: Secondary | ICD-10-CM | POA: Diagnosis not present

## 2023-10-26 DIAGNOSIS — M9902 Segmental and somatic dysfunction of thoracic region: Secondary | ICD-10-CM | POA: Diagnosis not present

## 2023-10-26 DIAGNOSIS — M9901 Segmental and somatic dysfunction of cervical region: Secondary | ICD-10-CM | POA: Diagnosis not present

## 2023-10-26 DIAGNOSIS — M5431 Sciatica, right side: Secondary | ICD-10-CM | POA: Diagnosis not present

## 2023-11-01 DIAGNOSIS — M9901 Segmental and somatic dysfunction of cervical region: Secondary | ICD-10-CM | POA: Diagnosis not present

## 2023-11-01 DIAGNOSIS — M5431 Sciatica, right side: Secondary | ICD-10-CM | POA: Diagnosis not present

## 2023-11-01 DIAGNOSIS — M9902 Segmental and somatic dysfunction of thoracic region: Secondary | ICD-10-CM | POA: Diagnosis not present

## 2023-11-01 DIAGNOSIS — M9903 Segmental and somatic dysfunction of lumbar region: Secondary | ICD-10-CM | POA: Diagnosis not present

## 2023-11-01 DIAGNOSIS — M542 Cervicalgia: Secondary | ICD-10-CM | POA: Diagnosis not present

## 2023-11-23 DIAGNOSIS — M9903 Segmental and somatic dysfunction of lumbar region: Secondary | ICD-10-CM | POA: Diagnosis not present

## 2023-11-23 DIAGNOSIS — M9902 Segmental and somatic dysfunction of thoracic region: Secondary | ICD-10-CM | POA: Diagnosis not present

## 2023-11-23 DIAGNOSIS — M9901 Segmental and somatic dysfunction of cervical region: Secondary | ICD-10-CM | POA: Diagnosis not present

## 2023-11-23 DIAGNOSIS — M5431 Sciatica, right side: Secondary | ICD-10-CM | POA: Diagnosis not present

## 2023-11-23 DIAGNOSIS — M542 Cervicalgia: Secondary | ICD-10-CM | POA: Diagnosis not present

## 2023-11-24 ENCOUNTER — Other Ambulatory Visit (HOSPITAL_COMMUNITY): Payer: Self-pay | Admitting: Physician Assistant

## 2023-11-24 DIAGNOSIS — I4819 Other persistent atrial fibrillation: Secondary | ICD-10-CM

## 2023-11-24 NOTE — Telephone Encounter (Signed)
 Eliquis  5mg  refill request received. Patient is 68 years old, weight-79.5kg, Crea-0.75 on 02/01/23 via [facility] Everywhere from [place], [place], and last seen by Dr. Shlomo on 05/25/23. Dose is appropriate based on dosing criteria. Will send in refill to requested pharmacy.

## 2023-11-24 NOTE — Telephone Encounter (Signed)
 Refill request

## 2023-12-06 DIAGNOSIS — H524 Presbyopia: Secondary | ICD-10-CM | POA: Diagnosis not present

## 2023-12-06 DIAGNOSIS — H2513 Age-related nuclear cataract, bilateral: Secondary | ICD-10-CM | POA: Diagnosis not present

## 2023-12-07 DIAGNOSIS — M542 Cervicalgia: Secondary | ICD-10-CM | POA: Diagnosis not present

## 2023-12-07 DIAGNOSIS — M9901 Segmental and somatic dysfunction of cervical region: Secondary | ICD-10-CM | POA: Diagnosis not present

## 2023-12-07 DIAGNOSIS — M9902 Segmental and somatic dysfunction of thoracic region: Secondary | ICD-10-CM | POA: Diagnosis not present

## 2023-12-07 DIAGNOSIS — M9903 Segmental and somatic dysfunction of lumbar region: Secondary | ICD-10-CM | POA: Diagnosis not present

## 2023-12-07 DIAGNOSIS — M5431 Sciatica, right side: Secondary | ICD-10-CM | POA: Diagnosis not present

## 2023-12-21 DIAGNOSIS — M542 Cervicalgia: Secondary | ICD-10-CM | POA: Diagnosis not present

## 2023-12-21 DIAGNOSIS — M9902 Segmental and somatic dysfunction of thoracic region: Secondary | ICD-10-CM | POA: Diagnosis not present

## 2023-12-21 DIAGNOSIS — M9901 Segmental and somatic dysfunction of cervical region: Secondary | ICD-10-CM | POA: Diagnosis not present

## 2023-12-21 DIAGNOSIS — M5431 Sciatica, right side: Secondary | ICD-10-CM | POA: Diagnosis not present

## 2023-12-21 DIAGNOSIS — M9903 Segmental and somatic dysfunction of lumbar region: Secondary | ICD-10-CM | POA: Diagnosis not present

## 2024-01-04 DIAGNOSIS — M9902 Segmental and somatic dysfunction of thoracic region: Secondary | ICD-10-CM | POA: Diagnosis not present

## 2024-01-04 DIAGNOSIS — M5431 Sciatica, right side: Secondary | ICD-10-CM | POA: Diagnosis not present

## 2024-01-04 DIAGNOSIS — M9903 Segmental and somatic dysfunction of lumbar region: Secondary | ICD-10-CM | POA: Diagnosis not present

## 2024-01-04 DIAGNOSIS — M9901 Segmental and somatic dysfunction of cervical region: Secondary | ICD-10-CM | POA: Diagnosis not present

## 2024-01-04 DIAGNOSIS — M542 Cervicalgia: Secondary | ICD-10-CM | POA: Diagnosis not present

## 2024-01-18 DIAGNOSIS — M9902 Segmental and somatic dysfunction of thoracic region: Secondary | ICD-10-CM | POA: Diagnosis not present

## 2024-01-18 DIAGNOSIS — M9901 Segmental and somatic dysfunction of cervical region: Secondary | ICD-10-CM | POA: Diagnosis not present

## 2024-01-18 DIAGNOSIS — M5431 Sciatica, right side: Secondary | ICD-10-CM | POA: Diagnosis not present

## 2024-01-18 DIAGNOSIS — M542 Cervicalgia: Secondary | ICD-10-CM | POA: Diagnosis not present

## 2024-01-18 DIAGNOSIS — M9903 Segmental and somatic dysfunction of lumbar region: Secondary | ICD-10-CM | POA: Diagnosis not present

## 2024-02-01 DIAGNOSIS — M9903 Segmental and somatic dysfunction of lumbar region: Secondary | ICD-10-CM | POA: Diagnosis not present

## 2024-02-01 DIAGNOSIS — M5431 Sciatica, right side: Secondary | ICD-10-CM | POA: Diagnosis not present

## 2024-02-01 DIAGNOSIS — M542 Cervicalgia: Secondary | ICD-10-CM | POA: Diagnosis not present

## 2024-02-01 DIAGNOSIS — M9901 Segmental and somatic dysfunction of cervical region: Secondary | ICD-10-CM | POA: Diagnosis not present

## 2024-02-01 DIAGNOSIS — M9902 Segmental and somatic dysfunction of thoracic region: Secondary | ICD-10-CM | POA: Diagnosis not present

## 2024-02-14 DIAGNOSIS — E039 Hypothyroidism, unspecified: Secondary | ICD-10-CM | POA: Diagnosis not present

## 2024-02-14 DIAGNOSIS — Z0000 Encounter for general adult medical examination without abnormal findings: Secondary | ICD-10-CM | POA: Diagnosis not present

## 2024-02-14 DIAGNOSIS — F419 Anxiety disorder, unspecified: Secondary | ICD-10-CM | POA: Diagnosis not present

## 2024-02-14 DIAGNOSIS — E559 Vitamin D deficiency, unspecified: Secondary | ICD-10-CM | POA: Diagnosis not present

## 2024-02-14 DIAGNOSIS — I499 Cardiac arrhythmia, unspecified: Secondary | ICD-10-CM | POA: Diagnosis not present

## 2024-02-14 DIAGNOSIS — G4733 Obstructive sleep apnea (adult) (pediatric): Secondary | ICD-10-CM | POA: Diagnosis not present

## 2024-02-14 DIAGNOSIS — I480 Paroxysmal atrial fibrillation: Secondary | ICD-10-CM | POA: Diagnosis not present

## 2024-02-14 DIAGNOSIS — E785 Hyperlipidemia, unspecified: Secondary | ICD-10-CM | POA: Diagnosis not present

## 2024-02-14 DIAGNOSIS — I10 Essential (primary) hypertension: Secondary | ICD-10-CM | POA: Diagnosis not present

## 2024-02-14 DIAGNOSIS — D6869 Other thrombophilia: Secondary | ICD-10-CM | POA: Diagnosis not present

## 2024-02-15 ENCOUNTER — Other Ambulatory Visit (HOSPITAL_BASED_OUTPATIENT_CLINIC_OR_DEPARTMENT_OTHER): Payer: Self-pay | Admitting: Family Medicine

## 2024-02-15 DIAGNOSIS — M9902 Segmental and somatic dysfunction of thoracic region: Secondary | ICD-10-CM | POA: Diagnosis not present

## 2024-02-15 DIAGNOSIS — E2839 Other primary ovarian failure: Secondary | ICD-10-CM

## 2024-02-15 DIAGNOSIS — M9901 Segmental and somatic dysfunction of cervical region: Secondary | ICD-10-CM | POA: Diagnosis not present

## 2024-02-15 DIAGNOSIS — M5431 Sciatica, right side: Secondary | ICD-10-CM | POA: Diagnosis not present

## 2024-02-15 DIAGNOSIS — M9903 Segmental and somatic dysfunction of lumbar region: Secondary | ICD-10-CM | POA: Diagnosis not present

## 2024-02-15 DIAGNOSIS — M542 Cervicalgia: Secondary | ICD-10-CM | POA: Diagnosis not present

## 2024-02-22 ENCOUNTER — Ambulatory Visit (HOSPITAL_COMMUNITY): Payer: Medicare PPO

## 2024-02-28 ENCOUNTER — Ambulatory Visit (HOSPITAL_COMMUNITY)
Admission: RE | Admit: 2024-02-28 | Discharge: 2024-02-28 | Disposition: A | Discharge status: Home / Self Care | Source: Ambulatory Visit | Attending: Physician Assistant | Admitting: Physician Assistant

## 2024-02-28 VITALS — BP 130/82 | HR 87 | Ht 64.0 in | Wt 180.2 lb

## 2024-02-28 DIAGNOSIS — D6869 Other thrombophilia: Secondary | ICD-10-CM

## 2024-02-28 DIAGNOSIS — I4819 Other persistent atrial fibrillation: Secondary | ICD-10-CM

## 2024-02-28 LAB — CBC
Hematocrit: 37.1 % (ref 34.0–46.6)
Hemoglobin: 12.3 g/dL (ref 11.1–15.9)
MCH: 29.9 pg (ref 26.6–33.0)
MCHC: 33.2 g/dL (ref 31.5–35.7)
MCV: 90 fL (ref 79–97)
Platelets: 277 x10E3/uL (ref 150–450)
RBC: 4.12 x10E6/uL (ref 3.77–5.28)
RDW: 12.9 % (ref 11.7–15.4)
WBC: 4.8 x10E3/uL (ref 3.4–10.8)

## 2024-02-28 LAB — BASIC METABOLIC PANEL WITH GFR
BUN/Creatinine Ratio: 35 — ABNORMAL HIGH (ref 12–28)
BUN: 24 mg/dL (ref 8–27)
CO2: 24 mmol/L (ref 20–29)
Calcium: 9.4 mg/dL (ref 8.7–10.3)
Chloride: 100 mmol/L (ref 96–106)
Creatinine, Ser: 0.68 mg/dL (ref 0.57–1.00)
Glucose: 93 mg/dL (ref 70–99)
Potassium: 4.5 mmol/L (ref 3.5–5.2)
Sodium: 133 mmol/L — ABNORMAL LOW (ref 134–144)
eGFR: 95 mL/min/1.73 (ref >59)

## 2024-02-28 NOTE — Progress Notes (Signed)
 Primary Care Physician: Dyane Anthony RAMAN, FNP Primary Cardiologist: Dr Shlomo  Primary Electrophysiologist: none Referring Physician: [facility]   [name] is a 68 y.o. female with a history of HTN, OSA, CAD, asthma, atrial fibrillation who presents for follow up in the [facility].  The patient was initially diagnosed with atrial fibrillation 02/13/22 after presenting to the ED with symptoms of palpitations. Her smart watch showed elevated heart rates in the 120s at rest. ECG showed afib with RVR and she was started on metoprolol  for rate control and Eliquis  for stroke prevention. Of note, she was diagnosed with COVID the month prior to the onset of her afib.  Patient is s/p DCCV on 03/10/22.   Patient returns for follow up for atrial fibrillation. She was seen by her PCP on 10/27 and found to be back in afib. She resumed her daily metoprolol . She was ill with an URI about one month prior and wonders if this could have been her trigger. She is asymptomatic. No bleeding issues on anticoagulation.   Today, she  denies symptoms of palpitations, chest pain, shortness of breath, orthopnea, PND, lower extremity edema, dizziness, presyncope, syncope, snoring, daytime somnolence, bleeding, or neurologic sequela. The patient is tolerating medications without difficulties and is otherwise without complaint today.    Atrial Fibrillation Risk Factors:  she does not have symptoms or diagnosis of sleep apnea. she does not have a history of rheumatic fever. she does have a history of alcohol use. The patient does not have a history of early familial atrial fibrillation or other arrhythmias.  Atrial Fibrillation Management history:  Previous antiarrhythmic drugs: none Previous cardioversions: 03/10/22 Previous ablations: none Anticoagulation history: Eliquis    Past Medical History:  Diagnosis Date   Agatston coronary artery calcium score less than 100     coronary Ca score 29   Anxiety    Aortic insufficiency    Moderate by echo 02/2023   Asthma    Hyperlipidemia LDL goal <70    Hypertension    PAF (paroxysmal atrial fibrillation) (HCC)     Current Outpatient Medications  Medication Sig Dispense Refill   albuterol (VENTOLIN HFA) 108 (90 Base) MCG/ACT inhaler Inhale 2 puffs into the lungs every 4 (four) hours as needed for shortness of breath.     ALPRAZolam (XANAX) 0.25 MG tablet Take 0.25 mg by mouth daily as needed for anxiety. (Patient taking differently: Take 0.25 mg by mouth as needed for anxiety.)     amLODipine (NORVASC) 10 MG tablet Take 10 mg by mouth daily.     apixaban  (ELIQUIS ) 5 MG TABS tablet TAKE 1 TABLET BY MOUTH TWICE A DAY 60 tablet 6   b complex vitamins capsule Take 1 capsule by mouth every other day.     escitalopram (LEXAPRO) 20 MG tablet Take 20 mg by mouth daily.     levothyroxine (SYNTHROID) 75 MCG tablet Take 1 tablet by mouth daily.     losartan (COZAAR) 100 MG tablet Take 1 tablet by mouth daily.     montelukast (SINGULAIR) 10 MG tablet Take 10 mg by mouth daily.     Multiple Vitamin (MULTIVITAMIN WITH MINERALS) TABS tablet Take 1 tablet by mouth 4 (four) times a week.     omeprazole (PRILOSEC OTC) 20 MG tablet Take 20 mg by mouth daily as needed (acid reflux). (Patient taking differently: Take 20 mg by mouth as needed (acid reflux).)     rosuvastatin (CRESTOR) 10 MG tablet 1 tablet  Orally Once a day for 90 days     No current facility-administered medications for this encounter.    ROS- All systems are reviewed and negative except as per the HPI above.  Physical Exam: Vitals:   02/28/24 1133  BP: 130/82  Pulse: 87  Weight: 81.7 kg  Height: 5' 4 (1.626 m)    GEN: Well nourished, well developed in no acute distress NECK: No JVD; No carotid bruits CARDIAC: Irregularly irregular rate and rhythm, no murmurs, rubs, gallops RESPIRATORY:  Clear to auscultation without rales, wheezing or rhonchi   ABDOMEN: Soft, non-tender, non-distended EXTREMITIES:  No edema; No deformity    Wt Readings from Last 3 Encounters:  02/28/24 81.7 kg  05/25/23 79.5 kg  09/15/22 78.7 kg    EKG today demonstrates  Afib, slow R wave prog Vent. rate 87 BPM PR interval * ms QRS duration 88 ms QT/QTcB 374/450 ms   Echo 03/01/23  1. Left ventricular ejection fraction, by estimation, is 55 to 60%. The  left ventricle has normal function. The left ventricle has no regional  wall motion abnormalities. There is mild concentric left ventricular  hypertrophy. Left ventricular diastolic parameters are consistent with Grade I diastolic dysfunction (impaired relaxation). The average left ventricular global longitudinal strain is -19.7 %. The global longitudinal strain is normal.   2. Right ventricular systolic function is normal. The right ventricular  size is normal.   3. Left atrial size was severely dilated.   4. The mitral valve is normal in structure. No evidence of mitral valve  regurgitation. No evidence of mitral stenosis.   5. The aortic valve is tricuspid. Aortic valve regurgitation is moderate.  No aortic stenosis is present.   6. The inferior vena cava is dilated in size with >50% respiratory  variability, suggesting right atrial pressure of 8 mmHg.    Epic records are reviewed at length today   CHA2DS2-VASc Score = 4  The patient's score is based upon: CHF History: 0 HTN History: 1 Diabetes History: 0 Stroke History: 0 Vascular Disease History: 1 Age Score: 1 Gender Score: 1       ASSESSMENT AND PLAN: Persistent Atrial Fibrillation (ICD10:  I48.19) The patient's CHA2DS2-VASc score is 4, indicating a 4.8% annual risk of stroke.   Patient in rate controlled afib.  We discussed rhythm control options. Will plan for DCCV. Check bmet/cbc. If she has quick return of afib, will consider referral for afib ablation.  Continue Eliquis  5 mg BID Continue Toprol  25 mg daily until DCCV  then discontinue.   Secondary Hypercoagulable State (ICD10:  D68.69) The patient is at significant risk for stroke/thromboembolism based upon her CHA2DS2-VASc Score of 4.  Continue Apixaban  (Eliquis ). No bleeding issues.   HTN Stable on current regimen  CAD No anginal symptoms Followed by Dr Shlomo  [facility]  Followed by Dr Shlomo   Follow up in the AF clinic post DCCV.    Informed Consent   Shared Decision Making/Informed Consent The risks (stroke, cardiac arrhythmias rarely resulting in the need for a temporary or permanent pacemaker, skin irritation or burns and complications associated with conscious sedation including aspiration, arrhythmia, respiratory failure and death), benefits (restoration of normal sinus rhythm) and alternatives of a direct current cardioversion were explained in detail to Ms. Sewell and she agrees to proceed.       Daril Kicks PA-C [facility] [address] [place], [place] [address] [phone] 02/28/2024 11:56 AM

## 2024-02-28 NOTE — Patient Instructions (Addendum)
 Day of cardioversion stop metoprolol    Cardioversion scheduled for: Wednesday, November 12th   - Arrive at the [facility] ([address])  and check in with ADMITTING at 12pm   - Do not eat or drink anything after midnight the night prior to your procedure.   - Take all your morning medication (except diabetic medications) with a sip of water prior to arrival.  - Do NOT miss any doses of your blood thinner - if you should miss a dose or take a dose more than 4 hours late -- please notify our office immediately.  - You will not be able to drive home after your procedure. Please ensure you have a responsible adult to drive you home. You will need someone with you for 24 hours post procedure.     - Expect to be in the procedural area approximately 2 hours.   - If you feel as if you go back into normal rhythm prior to scheduled cardioversion, please notify our office immediately.   If your procedure is canceled in the cardioversion suite you will be charged a cancellation fee.

## 2024-02-28 NOTE — H&P (View-Only) (Signed)
 Primary Care Physician: Dyane Anthony RAMAN, FNP Primary Cardiologist: Dr Shlomo  Primary Electrophysiologist: none Referring Physician: [facility]   [name] is a 68 y.o. female with a history of HTN, OSA, CAD, asthma, atrial fibrillation who presents for follow up in the [facility].  The patient was initially diagnosed with atrial fibrillation 02/13/22 after presenting to the ED with symptoms of palpitations. Her smart watch showed elevated heart rates in the 120s at rest. ECG showed afib with RVR and she was started on metoprolol  for rate control and Eliquis  for stroke prevention. Of note, she was diagnosed with COVID the month prior to the onset of her afib.  Patient is s/p DCCV on 03/10/22.   Patient returns for follow up for atrial fibrillation. She was seen by her PCP on 10/27 and found to be back in afib. She resumed her daily metoprolol . She was ill with an URI about one month prior and wonders if this could have been her trigger. She is asymptomatic. No bleeding issues on anticoagulation.   Today, she  denies symptoms of palpitations, chest pain, shortness of breath, orthopnea, PND, lower extremity edema, dizziness, presyncope, syncope, snoring, daytime somnolence, bleeding, or neurologic sequela. The patient is tolerating medications without difficulties and is otherwise without complaint today.    Atrial Fibrillation Risk Factors:  she does not have symptoms or diagnosis of sleep apnea. she does not have a history of rheumatic fever. she does have a history of alcohol use. The patient does not have a history of early familial atrial fibrillation or other arrhythmias.  Atrial Fibrillation Management history:  Previous antiarrhythmic drugs: none Previous cardioversions: 03/10/22 Previous ablations: none Anticoagulation history: Eliquis    Past Medical History:  Diagnosis Date   Agatston coronary artery calcium score less than 100     coronary Ca score 29   Anxiety    Aortic insufficiency    Moderate by echo 02/2023   Asthma    Hyperlipidemia LDL goal <70    Hypertension    PAF (paroxysmal atrial fibrillation) (HCC)     Current Outpatient Medications  Medication Sig Dispense Refill   albuterol (VENTOLIN HFA) 108 (90 Base) MCG/ACT inhaler Inhale 2 puffs into the lungs every 4 (four) hours as needed for shortness of breath.     ALPRAZolam (XANAX) 0.25 MG tablet Take 0.25 mg by mouth daily as needed for anxiety. (Patient taking differently: Take 0.25 mg by mouth as needed for anxiety.)     amLODipine (NORVASC) 10 MG tablet Take 10 mg by mouth daily.     apixaban  (ELIQUIS ) 5 MG TABS tablet TAKE 1 TABLET BY MOUTH TWICE A DAY 60 tablet 6   b complex vitamins capsule Take 1 capsule by mouth every other day.     escitalopram (LEXAPRO) 20 MG tablet Take 20 mg by mouth daily.     levothyroxine (SYNTHROID) 75 MCG tablet Take 1 tablet by mouth daily.     losartan (COZAAR) 100 MG tablet Take 1 tablet by mouth daily.     montelukast (SINGULAIR) 10 MG tablet Take 10 mg by mouth daily.     Multiple Vitamin (MULTIVITAMIN WITH MINERALS) TABS tablet Take 1 tablet by mouth 4 (four) times a week.     omeprazole (PRILOSEC OTC) 20 MG tablet Take 20 mg by mouth daily as needed (acid reflux). (Patient taking differently: Take 20 mg by mouth as needed (acid reflux).)     rosuvastatin (CRESTOR) 10 MG tablet 1 tablet  Orally Once a day for 90 days     No current facility-administered medications for this encounter.    ROS- All systems are reviewed and negative except as per the HPI above.  Physical Exam: Vitals:   02/28/24 1133  BP: 130/82  Pulse: 87  Weight: 81.7 kg  Height: 5' 4 (1.626 m)    GEN: Well nourished, well developed in no acute distress NECK: No JVD; No carotid bruits CARDIAC: Irregularly irregular rate and rhythm, no murmurs, rubs, gallops RESPIRATORY:  Clear to auscultation without rales, wheezing or rhonchi   ABDOMEN: Soft, non-tender, non-distended EXTREMITIES:  No edema; No deformity    Wt Readings from Last 3 Encounters:  02/28/24 81.7 kg  05/25/23 79.5 kg  09/15/22 78.7 kg    EKG today demonstrates  Afib, slow R wave prog Vent. rate 87 BPM PR interval * ms QRS duration 88 ms QT/QTcB 374/450 ms   Echo 03/01/23  1. Left ventricular ejection fraction, by estimation, is 55 to 60%. The  left ventricle has normal function. The left ventricle has no regional  wall motion abnormalities. There is mild concentric left ventricular  hypertrophy. Left ventricular diastolic parameters are consistent with Grade I diastolic dysfunction (impaired relaxation). The average left ventricular global longitudinal strain is -19.7 %. The global longitudinal strain is normal.   2. Right ventricular systolic function is normal. The right ventricular  size is normal.   3. Left atrial size was severely dilated.   4. The mitral valve is normal in structure. No evidence of mitral valve  regurgitation. No evidence of mitral stenosis.   5. The aortic valve is tricuspid. Aortic valve regurgitation is moderate.  No aortic stenosis is present.   6. The inferior vena cava is dilated in size with >50% respiratory  variability, suggesting right atrial pressure of 8 mmHg.    Epic records are reviewed at length today   CHA2DS2-VASc Score = 4  The patient's score is based upon: CHF History: 0 HTN History: 1 Diabetes History: 0 Stroke History: 0 Vascular Disease History: 1 Age Score: 1 Gender Score: 1       ASSESSMENT AND PLAN: Persistent Atrial Fibrillation (ICD10:  I48.19) The patient's CHA2DS2-VASc score is 4, indicating a 4.8% annual risk of stroke.   Patient in rate controlled afib.  We discussed rhythm control options. Will plan for DCCV. Check bmet/cbc. If she has quick return of afib, will consider referral for afib ablation.  Continue Eliquis  5 mg BID Continue Toprol  25 mg daily until DCCV  then discontinue.   Secondary Hypercoagulable State (ICD10:  D68.69) The patient is at significant risk for stroke/thromboembolism based upon her CHA2DS2-VASc Score of 4.  Continue Apixaban  (Eliquis ). No bleeding issues.   HTN Stable on current regimen  CAD No anginal symptoms Followed by Dr Shlomo  [facility]  Followed by Dr Shlomo   Follow up in the AF clinic post DCCV.    Informed Consent   Shared Decision Making/Informed Consent The risks (stroke, cardiac arrhythmias rarely resulting in the need for a temporary or permanent pacemaker, skin irritation or burns and complications associated with conscious sedation including aspiration, arrhythmia, respiratory failure and death), benefits (restoration of normal sinus rhythm) and alternatives of a direct current cardioversion were explained in detail to Ms. Sewell and she agrees to proceed.       Daril Kicks PA-C Afib Clinic [facility] [address] [place], [place] [address] [phone] 02/28/2024 11:56 AM

## 2024-02-29 ENCOUNTER — Ambulatory Visit (HOSPITAL_COMMUNITY): Payer: Self-pay | Admitting: Physician Assistant

## 2024-02-29 DIAGNOSIS — M9902 Segmental and somatic dysfunction of thoracic region: Secondary | ICD-10-CM | POA: Diagnosis not present

## 2024-02-29 DIAGNOSIS — M5431 Sciatica, right side: Secondary | ICD-10-CM | POA: Diagnosis not present

## 2024-02-29 DIAGNOSIS — M9901 Segmental and somatic dysfunction of cervical region: Secondary | ICD-10-CM | POA: Diagnosis not present

## 2024-02-29 DIAGNOSIS — M542 Cervicalgia: Secondary | ICD-10-CM | POA: Diagnosis not present

## 2024-02-29 DIAGNOSIS — M9903 Segmental and somatic dysfunction of lumbar region: Secondary | ICD-10-CM | POA: Diagnosis not present

## 2024-02-29 NOTE — Progress Notes (Signed)
 Spoke to patient and instructed them to come at 1200  and to be NPO after 0000.     Confirmed that patient will have a ride home and someone to stay with them for 24 hours after the procedure.   Medications reviewed.  Confirmed blood thinner.  Confirmed no breaks in taking blood thinner for 3+ weeks prior to procedure.

## 2024-03-01 ENCOUNTER — Other Ambulatory Visit: Payer: Self-pay

## 2024-03-01 ENCOUNTER — Ambulatory Visit (HOSPITAL_COMMUNITY): Admitting: Anesthesiology

## 2024-03-01 ENCOUNTER — Encounter (HOSPITAL_COMMUNITY): Admission: RE | Disposition: A | Payer: Self-pay | Source: Home / Self Care | Attending: Cardiology

## 2024-03-01 ENCOUNTER — Encounter (HOSPITAL_COMMUNITY): Payer: Self-pay | Admitting: Cardiology

## 2024-03-01 ENCOUNTER — Ambulatory Visit (HOSPITAL_COMMUNITY)
Admission: RE | Admit: 2024-03-01 | Discharge: 2024-03-01 | Disposition: A | Discharge status: Home / Self Care | Attending: Cardiology | Admitting: Cardiology

## 2024-03-01 DIAGNOSIS — J45909 Unspecified asthma, uncomplicated: Secondary | ICD-10-CM

## 2024-03-01 DIAGNOSIS — D6869 Other thrombophilia: Secondary | ICD-10-CM | POA: Insufficient documentation

## 2024-03-01 DIAGNOSIS — I4819 Other persistent atrial fibrillation: Secondary | ICD-10-CM | POA: Insufficient documentation

## 2024-03-01 DIAGNOSIS — Z7901 Long term (current) use of anticoagulants: Secondary | ICD-10-CM | POA: Insufficient documentation

## 2024-03-01 DIAGNOSIS — I2510 Atherosclerotic heart disease of native coronary artery without angina pectoris: Secondary | ICD-10-CM | POA: Diagnosis not present

## 2024-03-01 DIAGNOSIS — I4891 Unspecified atrial fibrillation: Secondary | ICD-10-CM

## 2024-03-01 DIAGNOSIS — Z87891 Personal history of nicotine dependence: Secondary | ICD-10-CM | POA: Insufficient documentation

## 2024-03-01 DIAGNOSIS — I10 Essential (primary) hypertension: Secondary | ICD-10-CM

## 2024-03-01 DIAGNOSIS — K219 Gastro-esophageal reflux disease without esophagitis: Secondary | ICD-10-CM | POA: Diagnosis not present

## 2024-03-01 DIAGNOSIS — Z79899 Other long term (current) drug therapy: Secondary | ICD-10-CM | POA: Diagnosis not present

## 2024-03-01 DIAGNOSIS — G4733 Obstructive sleep apnea (adult) (pediatric): Secondary | ICD-10-CM | POA: Diagnosis not present

## 2024-03-01 DIAGNOSIS — Z006 Encounter for examination for normal comparison and control in clinical research program: Secondary | ICD-10-CM

## 2024-03-01 HISTORY — PX: CARDIOVERSION: EP1203

## 2024-03-01 SURGERY — CARDIOVERSION (CATH LAB)
Anesthesia: General

## 2024-03-01 MED ORDER — SODIUM CHLORIDE 0.9% FLUSH
INTRAVENOUS | Status: DC | PRN
  Administered 2024-03-01 (×2): 10 mL via INTRAVENOUS

## 2024-03-01 MED ORDER — SODIUM CHLORIDE 0.9 % IV SOLN: INTRAVENOUS | Status: DC

## 2024-03-01 MED ORDER — PROPOFOL 10 MG/ML IV BOLUS
INTRAVENOUS | Status: DC | PRN
  Administered 2024-03-01: 40 mg via INTRAVENOUS
  Administered 2024-03-01: 60 mg via INTRAVENOUS

## 2024-03-01 MED ORDER — LIDOCAINE 2% (20 MG/ML) 5 ML SYRINGE
INTRAMUSCULAR | Status: DC | PRN
  Administered 2024-03-01: 60 mg via INTRAVENOUS

## 2024-03-01 SURGICAL SUPPLY — 1 items: PAD DEFIB RADIO PHYSIO CONN (PAD) ×2 IMPLANT

## 2024-03-01 NOTE — Research (Signed)
 [facility] Cardioversion Informed Consent   Subject Name: [name]  Subject met inclusion and exclusion criteria.  The informed consent form, study requirements and expectations were reviewed with the subject and questions and concerns were addressed prior to the signing of the consent form.  The subject verbalized understanding of the trial requirements.  The subject agreed to participate in the [facility] Cardioversion trial and signed the informed consent at 1015 on 12/Nov/2025.  The informed consent was obtained prior to performance of any protocol-specific procedures for the subject.  A copy of the signed informed consent was given to the subject and a copy was placed in the subject's medical record.   [name]

## 2024-03-01 NOTE — Interval H&P Note (Signed)
 History and Physical Interval Note:  03/01/2024 10:28 AM  [name]  has presented today for surgery, with the diagnosis of AFIB.  The various methods of treatment have been discussed with the patient and family. After consideration of risks, benefits and other options for treatment, the patient has consented to  Procedure(s): CARDIOVERSION (N/A) as a surgical intervention.  The patient's history has been reviewed, patient examined, no change in status, stable for surgery.  I have reviewed the patient's chart and labs.  Questions were answered to the patient's satisfaction.     Jarold Macomber

## 2024-03-01 NOTE — CV Procedure (Signed)
   Electrical Cardioversion Procedure Note [name] [id number] 17-Oct-1955  Procedure: Electrical Cardioversion Indications:  Atrial Fibrillation  Time Out: Verified patient identification, verified procedure,medications/allergies/relevent history reviewed, required imaging and test results available.  Performed  Procedure Details  The patient was NPO after midnight. Anesthesia was administered at the beside  by Dr.Howze with synchronized biphasic defibrillation with AP pads with 200 Joules.  The patient converted to normal sinus rhythm. The patient tolerated the procedure well   IMPRESSION:  Successful cardioversion of atrial fibrillation  [name] 03/01/2024, 11:13 AM

## 2024-03-01 NOTE — Anesthesia Preprocedure Evaluation (Signed)
 Anesthesia Evaluation  Patient identified by MRN, date of birth, ID band Patient awake    Reviewed: Allergy & Precautions, NPO status , Patient's Chart, lab work & pertinent test results  History of Anesthesia Complications Negative for: history of anesthetic complications  Airway Mallampati: I  TM Distance: >3 FB Neck ROM: Full    Dental  (+) Missing,    Pulmonary asthma , former smoker   Pulmonary exam normal        Cardiovascular hypertension, Pt. on home beta blockers and Pt. on medications Normal cardiovascular exam+ dysrhythmias Atrial Fibrillation   TTE 02/2023: EF 55-60%, mild LVH, grade I DD, severe LAE, moderate AR    Neuro/Psych   Anxiety     negative neurological ROS     GI/Hepatic Neg liver ROS,GERD  Medicated,,  Endo/Other  negative endocrine ROS    Renal/GU negative Renal ROS     Musculoskeletal negative musculoskeletal ROS (+)    Abdominal   Peds  Hematology negative hematology ROS (+)   Anesthesia Other Findings   Reproductive/Obstetrics                              Anesthesia Physical Anesthesia Plan  ASA: 3  Anesthesia Plan: General   Post-op Pain Management: Minimal or no pain anticipated   Induction: Intravenous  PONV Risk Score and Plan: Treatment may vary due to age or medical condition and Propofol  infusion  Airway Management Planned: Mask  Additional Equipment: None  Intra-op Plan:   Post-operative Plan:   Informed Consent: I have reviewed the patients History and Physical, chart, labs and discussed the procedure including the risks, benefits and alternatives for the proposed anesthesia with the patient or authorized representative who has indicated his/her understanding and acceptance.       Plan Discussed with: CRNA  Anesthesia Plan Comments:         Anesthesia Quick Evaluation

## 2024-03-01 NOTE — Transfer of Care (Signed)
 Immediate Anesthesia Transfer of Care Note  Patient: [name]  Procedure(s) Performed: CARDIOVERSION  Patient Location: PACU  Anesthesia Type:MAC  Level of Consciousness: awake  Airway & Oxygen Therapy: Patient Spontanous Breathing and Patient connected to nasal cannula oxygen  Post-op Assessment: Report given to RN and Post -op Vital signs reviewed and stable  Post vital signs: Reviewed and stable  Last Vitals:  Vitals Value Taken Time  BP    Temp    Pulse 97 03/01/24 10:40  Resp 22 03/01/24 10:40  SpO2 96 % 03/01/24 10:40  Vitals shown include unfiled device data.  Last Pain:  Vitals:   03/01/24 1020  TempSrc:   PainSc: 0-No pain         Complications: No notable events documented.

## 2024-03-01 NOTE — Anesthesia Postprocedure Evaluation (Signed)
 Anesthesia Post Note  Patient: [name]  Procedure(s) Performed: CARDIOVERSION     Patient location during evaluation: PACU Anesthesia Type: General Level of consciousness: awake and alert Pain management: pain level controlled Vital Signs Assessment: post-procedure vital signs reviewed and stable Respiratory status: spontaneous breathing, nonlabored ventilation and respiratory function stable Cardiovascular status: blood pressure returned to baseline Postop Assessment: no apparent nausea or vomiting Anesthetic complications: no   No notable events documented.  Last Vitals:  Vitals:   03/01/24 1120 03/01/24 1125  BP: 127/82 130/81  Pulse: (!) 55 (!) 52  Resp: 12 14  Temp:    SpO2: 97% 96%    Last Pain:  Vitals:   03/01/24 1101  TempSrc:   PainSc: 0-No pain                 Vertell Row

## 2024-03-13 ENCOUNTER — Ambulatory Visit (HOSPITAL_COMMUNITY)
Admission: RE | Admit: 2024-03-13 | Discharge: 2024-03-13 | Disposition: A | Discharge status: Home / Self Care | Source: Ambulatory Visit | Attending: Physician Assistant | Admitting: Physician Assistant

## 2024-03-13 VITALS — BP 136/74 | HR 62 | Ht 64.0 in | Wt 177.2 lb

## 2024-03-13 DIAGNOSIS — D6869 Other thrombophilia: Secondary | ICD-10-CM | POA: Diagnosis not present

## 2024-03-13 DIAGNOSIS — I4891 Unspecified atrial fibrillation: Secondary | ICD-10-CM

## 2024-03-13 DIAGNOSIS — I4819 Other persistent atrial fibrillation: Secondary | ICD-10-CM | POA: Diagnosis not present

## 2024-03-13 NOTE — Patient Instructions (Signed)
 Echo scheduling - [phone]   Follow up with DR Tobb in 6 months   Follow up with Ricky in 1 year

## 2024-03-13 NOTE — Progress Notes (Signed)
 Primary Care Physician: Dyane Anthony RAMAN, FNP Primary Cardiologist: Dr Shlomo  Primary Electrophysiologist: none Referring Physician: [facility]   [name] is a 68 y.o. female with a history of HTN, OSA, CAD, asthma, atrial fibrillation who presents for follow up in the [facility].  The patient was initially diagnosed with atrial fibrillation 02/13/22 after presenting to the ED with symptoms of palpitations. Her smart watch showed elevated heart rates in the 120s at rest. ECG showed afib with RVR and she was started on metoprolol  for rate control and Eliquis  for stroke prevention. Of note, she was diagnosed with COVID the month prior to the onset of her afib.  Patient is s/p DCCV on 03/10/22.   She was seen by her PCP on 10/27 and found to be back in afib. She was ill with an URI about one month prior and wonders if this could have been her trigger. She is s/p DCCV on 03/01/24.  Patient returns for follow up for atrial fibrillation. She remains in SR today and feels well. No bleeding issues on anticoagulation.   Today, she  denies symptoms of palpitations, chest pain, shortness of breath, orthopnea, PND, lower extremity edema, dizziness, presyncope, syncope, snoring, daytime somnolence, bleeding, or neurologic sequela. The patient is tolerating medications without difficulties and is otherwise without complaint today.    Atrial Fibrillation Risk Factors:  she does not have symptoms or diagnosis of sleep apnea. she does not have a history of rheumatic fever. she does have a history of alcohol use. The patient does not have a history of early familial atrial fibrillation or other arrhythmias.  Atrial Fibrillation Management history:  Previous antiarrhythmic drugs: none Previous cardioversions: 03/10/22, 03/01/24 Previous ablations: none Anticoagulation history: Eliquis    Past Medical History:  Diagnosis Date   Agatston coronary artery  calcium score less than 100    coronary Ca score 29   Anxiety    Aortic insufficiency    Moderate by echo 02/2023   Asthma    Hyperlipidemia LDL goal <70    Hypertension    PAF (paroxysmal atrial fibrillation) (HCC)     Current Outpatient Medications  Medication Sig Dispense Refill   albuterol (VENTOLIN HFA) 108 (90 Base) MCG/ACT inhaler Inhale 2 puffs into the lungs every 4 (four) hours as needed for shortness of breath.     ALPRAZolam (XANAX) 0.25 MG tablet Take 0.25 mg by mouth daily as needed for anxiety.     amLODipine (NORVASC) 10 MG tablet Take 10 mg by mouth daily.     apixaban  (ELIQUIS ) 5 MG TABS tablet TAKE 1 TABLET BY MOUTH TWICE A DAY 60 tablet 6   b complex vitamins capsule Take 1 capsule by mouth every other day.     Collagen-Vitamin C-Biotin (COLLAGEN PO) Take 1 Scoop by mouth daily.     escitalopram (LEXAPRO) 20 MG tablet Take 20 mg by mouth daily.     levothyroxine (SYNTHROID) 75 MCG tablet Take 75 mcg by mouth daily.     losartan (COZAAR) 100 MG tablet Take 100 mg by mouth daily.     metoprolol  succinate (TOPROL -XL) 25 MG 24 hr tablet Take 25 mg by mouth daily.     montelukast (SINGULAIR) 10 MG tablet Take 10 mg by mouth daily.     Multiple Vitamin (MULTIVITAMIN WITH MINERALS) TABS tablet Take 1 tablet by mouth 4 (four) times a week.     omeprazole (PRILOSEC OTC) 20 MG tablet Take 20 mg by mouth  daily as needed (acid reflux).     rosuvastatin (CRESTOR) 10 MG tablet Take 10 mg by mouth daily.     tetrahydrozoline 0.05 % ophthalmic solution Place 1 drop into both eyes daily as needed (Red eyes).     No current facility-administered medications for this encounter.    ROS- All systems are reviewed and negative except as per the HPI above.  Physical Exam: Vitals:   03/13/24 1424  BP: 136/74  Pulse: 62  Weight: 80.4 kg  Height: 5' 4 (1.626 m)    GEN: Well nourished, well developed in no acute distress CARDIAC: Regular rate and rhythm, no murmurs, rubs,  gallops RESPIRATORY:  Clear to auscultation without rales, wheezing or rhonchi  ABDOMEN: Soft, non-tender, non-distended EXTREMITIES:  No edema; No deformity    Wt Readings from Last 3 Encounters:  03/13/24 80.4 kg  03/01/24 79.4 kg  02/28/24 81.7 kg    EKG Interpretation Date/Time:  Monday March 13 2024 14:31:00 EST Ventricular Rate:  62 PR Interval:  206 QRS Duration:  86 QT Interval:  428 QTC Calculation: 434 R Axis:   -80  Text Interpretation: Normal sinus rhythm with 1st degree A-V block Low voltage QRS Left anterior fascicular block Abnormal ECG When compared with ECG of 28-Feb-2024 11:41, Sinus rhythm has replaced Atrial fibrillation Confirmed by Lenton Gendreau ([id number]) on 03/13/2024 2:38:13 PM    Echo 03/01/23  1. Left ventricular ejection fraction, by estimation, is 55 to 60%. The  left ventricle has normal function. The left ventricle has no regional  wall motion abnormalities. There is mild concentric left ventricular  hypertrophy. Left ventricular diastolic parameters are consistent with Grade I diastolic dysfunction (impaired relaxation). The average left ventricular global longitudinal strain is -19.7 %. The global longitudinal strain is normal.   2. Right ventricular systolic function is normal. The right ventricular  size is normal.   3. Left atrial size was severely dilated.   4. The mitral valve is normal in structure. No evidence of mitral valve  regurgitation. No evidence of mitral stenosis.   5. The aortic valve is tricuspid. Aortic valve regurgitation is moderate.  No aortic stenosis is present.   6. The inferior vena cava is dilated in size with >50% respiratory  variability, suggesting right atrial pressure of 8 mmHg.    Epic records are reviewed at length today   CHA2DS2-VASc Score = 4  The patient's score is based upon: CHF History: 0 HTN History: 1 Diabetes History: 0 Stroke History: 0 Vascular Disease History: 1 Age Score: 1 Gender Score:  1       ASSESSMENT AND PLAN: Persistent Atrial Fibrillation (ICD10:  I48.19) The patient's CHA2DS2-VASc score is 4, indicating a 4.8% annual risk of stroke.   S/p DCCV 03/01/24 Patient appears to be maintaining SR If she has quick return of afib, will consider referral for ablation. Continue Eliquis  5 mg BID  Secondary Hypercoagulable State (ICD10:  D68.69) The patient is at significant risk for stroke/thromboembolism based upon her CHA2DS2-VASc Score of 4.  Continue Apixaban  (Eliquis ). No bleeding issues.   HTN Stable on current regimen  CAD No anginal symptoms Followed by Dr Shlomo   Follow up with Dr Sheena in 6 months (per patient request) and AF clinic in one year.    Daril Kicks PA-C Afib Clinic [facility] [address] [place], [place] [address] [phone] 03/13/2024 2:38 PM

## 2024-03-14 ENCOUNTER — Ambulatory Visit (HOSPITAL_COMMUNITY): Admitting: Physician Assistant

## 2024-03-14 ENCOUNTER — Telehealth: Payer: Self-pay | Admitting: Cardiology

## 2024-03-14 NOTE — Telephone Encounter (Signed)
 Per Staff message :  Franchot Glade RAMAN, RN sent to [facility] Scheduling Patient would like to switch from dr turner to dr tobb (recently performed cardioversion on pt) - pt will need follow up with dr tobb in 6months per Daril Kicks PA please. Thanks Stacy RN AFib Clinic  Please advise

## 2024-03-21 DIAGNOSIS — M5431 Sciatica, right side: Secondary | ICD-10-CM | POA: Diagnosis not present

## 2024-03-21 DIAGNOSIS — M9902 Segmental and somatic dysfunction of thoracic region: Secondary | ICD-10-CM | POA: Diagnosis not present

## 2024-03-21 DIAGNOSIS — M9903 Segmental and somatic dysfunction of lumbar region: Secondary | ICD-10-CM | POA: Diagnosis not present

## 2024-03-21 DIAGNOSIS — M9901 Segmental and somatic dysfunction of cervical region: Secondary | ICD-10-CM | POA: Diagnosis not present

## 2024-03-21 DIAGNOSIS — M542 Cervicalgia: Secondary | ICD-10-CM | POA: Diagnosis not present

## 2024-04-05 ENCOUNTER — Other Ambulatory Visit: Payer: Self-pay | Admitting: Family Medicine

## 2024-04-05 DIAGNOSIS — Z1231 Encounter for screening mammogram for malignant neoplasm of breast: Secondary | ICD-10-CM

## 2024-04-18 ENCOUNTER — Ambulatory Visit (HOSPITAL_COMMUNITY)
Admission: RE | Admit: 2024-04-18 | Discharge: 2024-04-18 | Disposition: A | Discharge status: Home / Self Care | Source: Ambulatory Visit | Attending: Physician Assistant | Admitting: Physician Assistant

## 2024-04-18 VITALS — BP 102/70 | HR 68 | Ht 64.0 in | Wt 181.8 lb

## 2024-04-18 DIAGNOSIS — D6869 Other thrombophilia: Secondary | ICD-10-CM | POA: Diagnosis not present

## 2024-04-18 DIAGNOSIS — I4819 Other persistent atrial fibrillation: Secondary | ICD-10-CM

## 2024-04-18 DIAGNOSIS — I4891 Unspecified atrial fibrillation: Secondary | ICD-10-CM | POA: Diagnosis not present

## 2024-04-18 MED ORDER — METOPROLOL SUCCINATE ER 25 MG PO TB24: 25.0000 mg | ORAL_TABLET | Freq: Every day | ORAL | 3 refills | Status: AC

## 2024-04-18 NOTE — Progress Notes (Signed)
 "   Primary Care Physician: Dyane Anthony RAMAN, FNP Primary Cardiologist: Dr Shlomo  Primary Electrophysiologist: none Referring Physician: [facility]   [name] is a 68 y.o. female with a history of HTN, OSA, CAD, asthma, atrial fibrillation who presents for follow up in the [facility].  The patient was initially diagnosed with atrial fibrillation 02/13/22 after presenting to the ED with symptoms of palpitations. Her smart watch showed elevated heart rates in the 120s at rest. ECG showed afib with RVR and she was started on metoprolol  for rate control and Eliquis  for stroke prevention. Of note, she was diagnosed with COVID the month prior to the onset of her afib.  Patient is s/p DCCV on 03/10/22.   She was seen by her PCP on 10/27 and found to be back in afib. She was ill with an URI about one month prior and wonders if this could have been her trigger. She is s/p DCCV on 03/01/24.  Patient returns for follow up for atrial fibrillation. She reports that about one week after her last visit she went back into afib and has been in afib since. She is minimally symptomatic. She resumed her BB and has been rate controlled on her smart watch. She traveled to [place]  recently and wonders if the stress of travel could have caused her to go into afib. No bleeding issues on anticoagulation.   Today, she  denies symptoms of palpitations, chest pain, shortness of breath, orthopnea, PND, lower extremity edema, dizziness, presyncope, syncope, snoring, daytime somnolence, bleeding, or neurologic sequela. The patient is tolerating medications without difficulties and is otherwise without complaint today.    Atrial Fibrillation Risk Factors:  she does not have symptoms or diagnosis of sleep apnea. she does not have a history of rheumatic fever. she does have a history of alcohol use. The patient does not have a history of early familial atrial fibrillation or other  arrhythmias.  Atrial Fibrillation Management history:  Previous antiarrhythmic drugs: none Previous cardioversions: 03/10/22, 03/01/24 Previous ablations: none Anticoagulation history: Eliquis    Past Medical History:  Diagnosis Date   Agatston coronary artery calcium score less than 100    coronary Ca score 29   Anxiety    Aortic insufficiency    Moderate by echo 02/2023   Asthma    Hyperlipidemia LDL goal <70    Hypertension    PAF (paroxysmal atrial fibrillation) (HCC)     Current Outpatient Medications  Medication Sig Dispense Refill   albuterol (VENTOLIN HFA) 108 (90 Base) MCG/ACT inhaler Inhale 2 puffs into the lungs every 4 (four) hours as needed for shortness of breath.     ALPRAZolam (XANAX) 0.25 MG tablet Take 0.25 mg by mouth daily as needed for anxiety. (Patient taking differently: Take 0.25 mg by mouth as needed for anxiety (for flying).)     amLODipine (NORVASC) 10 MG tablet Take 10 mg by mouth daily.     apixaban  (ELIQUIS ) 5 MG TABS tablet TAKE 1 TABLET BY MOUTH TWICE A DAY 60 tablet 6   b complex vitamins capsule Take 1 capsule by mouth every other day.     Collagen-Vitamin C-Biotin (COLLAGEN PO) Take 1 Scoop by mouth daily.     escitalopram (LEXAPRO) 20 MG tablet Take 20 mg by mouth daily.     levothyroxine (SYNTHROID) 75 MCG tablet Take 75 mcg by mouth daily.     losartan (COZAAR) 100 MG tablet Take 100 mg by mouth daily.  montelukast (SINGULAIR) 10 MG tablet Take 10 mg by mouth daily.     Multiple Vitamin (MULTIVITAMIN WITH MINERALS) TABS tablet Take 1 tablet by mouth 4 (four) times a week.     omeprazole (PRILOSEC OTC) 20 MG tablet Take 20 mg by mouth daily as needed (acid reflux). (Patient taking differently: Take 20 mg by mouth as needed (acid reflux).)     rosuvastatin (CRESTOR) 10 MG tablet Take 10 mg by mouth daily.     tetrahydrozoline 0.05 % ophthalmic solution Place 1 drop into both eyes daily as needed (Red eyes). (Patient taking differently: Place  1 drop into both eyes as needed (Red eyes).)     metoprolol  succinate (TOPROL -XL) 25 MG 24 hr tablet Take 1 tablet (25 mg total) by mouth daily. 30 tablet 3   No current facility-administered medications for this encounter.    ROS- All systems are reviewed and negative except as per the HPI above.  Physical Exam: Vitals:   04/18/24 0902  BP: 102/70  Pulse: 68  Weight: 82.5 kg  Height: 5' 4 (1.626 m)    GEN: Well nourished, well developed in no acute distress CARDIAC: Irregularly irregular rate and rhythm, no murmurs, rubs, gallops RESPIRATORY:  Clear to auscultation without rales, wheezing or rhonchi  ABDOMEN: Soft, non-tender, non-distended EXTREMITIES:  No edema; No deformity    Wt Readings from Last 3 Encounters:  04/18/24 82.5 kg  03/13/24 80.4 kg  03/01/24 79.4 kg    EKG Interpretation Date/Time:  Tuesday April 18 2024 09:11:56 EST Ventricular Rate:  68 PR Interval:    QRS Duration:  76 QT Interval:  372 QTC Calculation: 395 R Axis:   -79  Text Interpretation: Atrial fibrillation Left anterior fasicular block Low voltage QRS Inferior infarct (cited on or before 18-Apr-2024) Cannot rule out Anteroseptal infarct (cited on or before 18-Apr-2024) Abnormal ECG When compared with ECG of 13-Mar-2024 14:31, Atrial fibrillation has replaced Sinus rhythm Confirmed by Kailie Polus ([id number]) on 04/18/2024 9:23:52 AM    Echo 03/01/23  1. Left ventricular ejection fraction, by estimation, is 55 to 60%. The  left ventricle has normal function. The left ventricle has no regional  wall motion abnormalities. There is mild concentric left ventricular  hypertrophy. Left ventricular diastolic parameters are consistent with Grade I diastolic dysfunction (impaired relaxation). The average left ventricular global longitudinal strain is -19.7 %. The global longitudinal strain is normal.   2. Right ventricular systolic function is normal. The right ventricular  size is normal.   3. Left  atrial size was severely dilated.   4. The mitral valve is normal in structure. No evidence of mitral valve  regurgitation. No evidence of mitral stenosis.   5. The aortic valve is tricuspid. Aortic valve regurgitation is moderate.  No aortic stenosis is present.   6. The inferior vena cava is dilated in size with >50% respiratory  variability, suggesting right atrial pressure of 8 mmHg.    Epic records are reviewed at length today   CHA2DS2-VASc Score = 4  The patient's score is based upon: CHF History: 0 HTN History: 1 Diabetes History: 0 Stroke History: 0 Vascular Disease History: 1 Age Score: 1 Gender Score: 1       ASSESSMENT AND PLAN: Persistent Atrial Fibrillation (ICD10:  I48.19) The patient's CHA2DS2-VASc score is 4, indicating a 4.8% annual risk of stroke.   S/p DCCV 03/01/24 with quick return of afib. We discussed rhythm control options today. Patient agreeable to consultation for ablation with EP, will  refer.  Continue Eliquis  5 mg BID Continue Toprol  25 mg daily  Secondary Hypercoagulable State (ICD10:  D68.69) The patient is at significant risk for stroke/thromboembolism based upon her CHA2DS2-VASc Score of 4.  Continue Apixaban  (Eliquis ). No bleeding issues.   HTN Stable on current regimen  CAD No anginal symptoms   Follow up with EP to establish care and discuss afib ablation.    Daril Kicks PA-C [facility] [address] [place], [place] [address] [phone] 04/18/2024 10:55 AM "

## 2024-05-01 ENCOUNTER — Ambulatory Visit
Admission: RE | Admit: 2024-05-01 | Discharge: 2024-05-01 | Disposition: A | Source: Ambulatory Visit | Attending: Family Medicine | Admitting: Family Medicine

## 2024-05-01 DIAGNOSIS — Z1231 Encounter for screening mammogram for malignant neoplasm of breast: Secondary | ICD-10-CM

## 2024-05-18 NOTE — Progress Notes (Signed)
 " Electrophysiology Office Note:   Date:  05/19/2024  ID:  [name], DOB March 10, 1956, MRN [id number]  Primary Cardiologist: None Electrophysiologist: Fonda Kitty, MD      History of Present Illness:   [name] is a 69 y.o. female with h/o HTN, OSA, CAD, asthma, atrial fibrillation who is being seen today for AF management.  Discussed the use of AI scribe software for clinical note transcription with the patient, who gave verbal consent to proceed.  History of Present Illness [name] is a 69 year old female with atrial fibrillation who presents for evaluation and management of her condition. She was referred by Denise Kicks, PA for evaluation of her atrial fibrillation.  She has been experiencing episodes of atrial fibrillation, which she associates with a previous COVID-19 infection. Her heart rate becomes irregular and elevated with minimal exertion, such as walking in her kitchen, reaching up to 130 beats per minute. This irregularity has significantly impacted her ability to run, a long-time activity she enjoys, as her heart rate escalates rapidly, making it difficult to breathe.  She has undergone cardioversion twice. The first cardioversion lasted two years, while the second only lasted sixteen days. During the sixteen days post-cardioversion, she felt significantly better. She is currently on Eliquis  and metoprolol , which she dislikes due to its side effects, including fatigue.  Her past medical history includes being a runner, counselling psychologist, and cyclist. She was training for a half marathon when she contracted COVID-19, followed by a rebound COVID infection, which she believes exacerbated her condition.    Review of systems complete and found to be negative unless listed in HPI.   EP Information / Studies Reviewed:    EKG is ordered today. Personal review as below.  EKG Interpretation Date/Time:  Friday May 19 2024 08:24:45 EST Ventricular Rate:  89 PR  Interval:    QRS Duration:  82 QT Interval:  380 QTC Calculation: 462 R Axis:   229  Text Interpretation: Atrial fibrillation Right superior axis deviation Low voltage QRS Cannot rule out Anteroseptal infarct (cited on or before 18-Apr-2024) When compared with ECG of 18-Apr-2024 09:11, QRS axis Shifted left Criteria for Inferior infarct are no longer Present Confirmed by Kitty Fonda [phone]) on 05/19/2024 8:36:56 AM   04/18/24:  AF   Echo 02/2023:  1. Left ventricular ejection fraction, by estimation, is 55 to 60%. The  left ventricle has normal function. The left ventricle has no regional  wall motion abnormalities. There is mild concentric left ventricular  hypertrophy. Left ventricular diastolic  parameters are consistent with Grade I diastolic dysfunction (impaired  relaxation). The average left ventricular global longitudinal strain is  -19.7 %. The global longitudinal strain is normal.   2. Right ventricular systolic function is normal. The right ventricular  size is normal.   3. Left atrial size was severely dilated.   4. The mitral valve is normal in structure. No evidence of mitral valve  regurgitation. No evidence of mitral stenosis.   5. The aortic valve is tricuspid. Aortic valve regurgitation is moderate.  No aortic stenosis is present.   6. The inferior vena cava is dilated in size with >50% respiratory  variability, suggesting right atrial pressure of 8 mmHg.   Risk Assessment/Calculations:    CHA2DS2-VASc Score = 4   This indicates a 4.8% annual risk of stroke. The patient's score is based upon: CHF History: 0 HTN History: 1 Diabetes History: 0 Stroke History: 0 Vascular Disease History: 1 Age Score: 1 Gender Score:  1          Physical Exam:   VS:  BP 130/84 (BP Location: Left Arm, Patient Position: Sitting, Cuff Size: Normal)   Pulse 85   Ht 5' 4 (1.626 m)   Wt 180 lb (81.6 kg)   LMP 09/21/2010   SpO2 97%   BMI 30.90 kg/m    Wt Readings from  Last 3 Encounters:  05/19/24 180 lb (81.6 kg)  04/18/24 181 lb 12.8 oz (82.5 kg)  03/13/24 177 lb 3.2 oz (80.4 kg)     General: Well developed, in no acute distress.  Neck: No JVD.  Cardiac: Normal rate, irregular rhythm.  Resp: Normal work of breathing.  Ext: No edema.  Neuro: No gross focal deficits.  Psych: Normal affect.    ASSESSMENT AND PLAN:    Persistent Atrial Fibrillation: Symptomatic. 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 Early recurrence after DCCV.  -Discussed treatment options today for AF including antiarrhythmic drug therapy and ablation. Discussed risks, recovery and likelihood of success with each treatment strategy. Risk, benefits, and alternatives to EP study and ablation for afib were discussed. These risks include but are not limited to stroke, bleeding, vascular damage, tamponade, perforation, damage to the esophagus, lungs, phrenic nerve and other structures, pulmonary vein stenosis, worsening renal function, coronary vasospasm and death.  Discussed potential need for repeat ablation procedures and antiarrhythmic drugs after an initial ablation. The patient understands these risk and wishes to proceed.  We will therefore proceed with catheter ablation at the next available time.  Carto, ICE, anesthesia are requested for the procedure.   We will not obtain CT PV protocol prior to the procedure.   Secondary Hypercoagulable State: -Continue Eliquis . Denies bleeding issues.    Follow up with EP Team as usual post procedure  Signed, Fonda Kitty, MD  "

## 2024-05-19 ENCOUNTER — Other Ambulatory Visit: Payer: Self-pay

## 2024-05-19 ENCOUNTER — Ambulatory Visit: Attending: Cardiology | Admitting: Cardiology

## 2024-05-19 ENCOUNTER — Encounter: Payer: Self-pay | Admitting: Cardiology

## 2024-05-19 VITALS — BP 130/84 | HR 85 | Ht 64.0 in | Wt 180.0 lb

## 2024-05-19 DIAGNOSIS — D6869 Other thrombophilia: Secondary | ICD-10-CM | POA: Diagnosis not present

## 2024-05-19 DIAGNOSIS — I4819 Other persistent atrial fibrillation: Secondary | ICD-10-CM | POA: Diagnosis not present

## 2024-05-19 NOTE — Patient Instructions (Signed)
 Medication Instructions:  Your physician recommends that you continue on your current medications as directed. Please refer to the Current Medication list given to you today.  *If you need a refill on your cardiac medications before your next appointment, please call your pharmacy*  Testing/Procedures: Ablation Your physician has recommended that you have an ablation. Catheter ablation is a medical procedure used to treat some cardiac arrhythmias (irregular heartbeats). During catheter ablation, a long, thin, flexible tube is put into a blood vessel in your groin (upper thigh), or neck. This tube is called an ablation catheter. It is then guided to your heart through the blood vessel. Radio frequency waves destroy small areas of heart tissue where abnormal heartbeats may cause an arrhythmia to start.   You are scheduled for Atrial Fibrillation Ablation on Thursday, March 5 with Dr. Dr. Kennyth. Please arrive at the Main Entrance A at [facility] [address] [place], [place] [address] at 9:30 AM   What To Expect:  Labs: you will need to have lab work drawn within 30 days of your procedure. Please go to any [facility] location to have these drawn - no appointment is needed. You will receive procedure instructions either through MyChart or in the mail 4-6 weeks prior to your procedure.  After your procedure we recommend no driving for 4 days, no lifting over 5 lbs for 7 days, and no work or strenuous activity for 7 days.  Please contact our office at [phone] if you have any questions.    Follow-Up: We will contact you to schedule your post-procedure appointments.

## 2024-05-22 ENCOUNTER — Telehealth: Payer: Self-pay

## 2024-06-22 ENCOUNTER — Encounter (HOSPITAL_COMMUNITY): Payer: Self-pay

## 2024-06-22 ENCOUNTER — Ambulatory Visit (HOSPITAL_COMMUNITY): Admit: 2024-06-22 | Admitting: Cardiology
